# Patient Record
Sex: Male | Born: 1950
Health system: Southern US, Community
[De-identification: ages and names within clinical notes are randomized; demographics above are authoritative.]

## PROBLEM LIST (undated history)

## (undated) DIAGNOSIS — Z85828 Personal history of other malignant neoplasm of skin: Secondary | ICD-10-CM

## (undated) DIAGNOSIS — Z8679 Personal history of other diseases of the circulatory system: Secondary | ICD-10-CM

## (undated) DIAGNOSIS — Z8639 Personal history of other endocrine, nutritional and metabolic disease: Secondary | ICD-10-CM

## (undated) DIAGNOSIS — C61 Malignant neoplasm of prostate: Secondary | ICD-10-CM

## (undated) DIAGNOSIS — I499 Cardiac arrhythmia, unspecified: Secondary | ICD-10-CM

## (undated) DIAGNOSIS — M5020 Other cervical disc displacement, unspecified cervical region: Secondary | ICD-10-CM

## (undated) DIAGNOSIS — E785 Hyperlipidemia, unspecified: Secondary | ICD-10-CM

## (undated) DIAGNOSIS — C4490 Unspecified malignant neoplasm of skin, unspecified: Secondary | ICD-10-CM

## (undated) HISTORY — PX: APPENDECTOMY: SHX54

## (undated) HISTORY — PX: OTHER SURGICAL HISTORY: SHX169

## (undated) HISTORY — DX: Hyperlipidemia, unspecified: E78.5

---

## 2000-10-14 ENCOUNTER — Ambulatory Visit (HOSPITAL_COMMUNITY): Admission: RE | Admit: 2000-10-14 | Discharge: 2000-10-14 | Payer: Self-pay | Admitting: Internal Medicine

## 2000-10-14 ENCOUNTER — Encounter: Payer: Self-pay | Admitting: Internal Medicine

## 2002-11-25 HISTORY — PX: COLONOSCOPY: SHX174

## 2005-11-25 DIAGNOSIS — E785 Hyperlipidemia, unspecified: Secondary | ICD-10-CM

## 2005-11-25 HISTORY — DX: Hyperlipidemia, unspecified: E78.5

## 2006-04-10 ENCOUNTER — Ambulatory Visit: Payer: Self-pay | Admitting: Internal Medicine

## 2006-04-18 ENCOUNTER — Ambulatory Visit: Payer: Self-pay | Admitting: Internal Medicine

## 2006-05-15 ENCOUNTER — Ambulatory Visit: Payer: Self-pay | Admitting: Internal Medicine

## 2006-07-23 ENCOUNTER — Ambulatory Visit: Payer: Self-pay | Admitting: Internal Medicine

## 2007-04-01 ENCOUNTER — Ambulatory Visit: Payer: Self-pay | Admitting: Internal Medicine

## 2007-04-01 LAB — CONVERTED CEMR LAB
ALT: 28 units/L (ref 0–40)
AST: 27 units/L (ref 0–37)

## 2007-08-04 ENCOUNTER — Ambulatory Visit: Payer: Self-pay | Admitting: Internal Medicine

## 2007-08-09 LAB — CONVERTED CEMR LAB
Cholesterol: 135 mg/dL (ref 0–200)
Total CHOL/HDL Ratio: 3.2
VLDL: 40 mg/dL (ref 0–40)

## 2007-08-10 ENCOUNTER — Encounter (INDEPENDENT_AMBULATORY_CARE_PROVIDER_SITE_OTHER): Payer: Self-pay | Admitting: *Deleted

## 2007-08-14 ENCOUNTER — Ambulatory Visit: Payer: Self-pay | Admitting: Internal Medicine

## 2007-08-14 LAB — CONVERTED CEMR LAB: HDL goal, serum: 40 mg/dL

## 2008-10-28 ENCOUNTER — Ambulatory Visit: Payer: Self-pay | Admitting: Internal Medicine

## 2008-11-07 ENCOUNTER — Ambulatory Visit: Payer: Self-pay | Admitting: Internal Medicine

## 2008-11-29 ENCOUNTER — Ambulatory Visit: Payer: Self-pay | Admitting: Internal Medicine

## 2008-12-04 LAB — CONVERTED CEMR LAB
AST: 25 units/L (ref 0–37)
Alkaline Phosphatase: 40 units/L (ref 39–117)
Bilirubin, Direct: 0.1 mg/dL (ref 0.0–0.3)
Cholesterol: 138 mg/dL (ref 0–200)
GFR calc Af Amer: 98 mL/min
Glucose, Bld: 85 mg/dL (ref 70–99)
PSA: 1.17 ng/mL (ref 0.10–4.00)
TSH: 1.48 microintl units/mL (ref 0.35–5.50)
VLDL: 28 mg/dL (ref 0–40)

## 2009-02-02 LAB — CONVERTED CEMR LAB: OCCULT 1: NEGATIVE

## 2010-05-09 ENCOUNTER — Ambulatory Visit: Payer: Self-pay | Admitting: Internal Medicine

## 2010-05-09 LAB — CONVERTED CEMR LAB
Blood in Urine, dipstick: NEGATIVE
Ketones, urine, test strip: NEGATIVE
Urobilinogen, UA: 0.2
WBC Urine, dipstick: NEGATIVE
pH: 5

## 2010-05-13 LAB — CONVERTED CEMR LAB
AST: 23 units/L (ref 0–37)
Albumin: 4.3 g/dL (ref 3.5–5.2)
Alkaline Phosphatase: 45 units/L (ref 39–117)
Bilirubin, Direct: 0.1 mg/dL (ref 0.0–0.3)
CO2: 29 meq/L (ref 19–32)
Cholesterol: 153 mg/dL (ref 0–200)
Eosinophils Absolute: 0.2 10*3/uL (ref 0.0–0.7)
Eosinophils Relative: 3.9 % (ref 0.0–5.0)
HDL: 48 mg/dL (ref >39.00)
LDL Cholesterol: 83 mg/dL (ref 0–99)
Lymphocytes Relative: 32.2 % (ref 12.0–46.0)
MCHC: 35.3 g/dL (ref 30.0–36.0)
MCV: 91.2 fL (ref 78.0–100.0)
Monocytes Relative: 9.1 % (ref 3.0–12.0)
Platelets: 190 10*3/uL (ref 150.0–400.0)
RBC: 4.71 M/uL (ref 4.22–5.81)
RDW: 13.2 % (ref 11.5–14.6)
Sodium: 142 meq/L (ref 135–145)
VLDL: 21.6 mg/dL (ref 0.0–40.0)
WBC: 5.5 10*3/uL (ref 4.5–10.5)

## 2010-06-05 ENCOUNTER — Ambulatory Visit: Payer: Self-pay | Admitting: Internal Medicine

## 2010-06-20 ENCOUNTER — Encounter: Payer: Self-pay | Admitting: Internal Medicine

## 2010-08-22 ENCOUNTER — Encounter: Payer: Self-pay | Admitting: Internal Medicine

## 2010-08-29 ENCOUNTER — Encounter: Payer: Self-pay | Admitting: Internal Medicine

## 2010-09-18 ENCOUNTER — Ambulatory Visit: Payer: Self-pay | Admitting: Internal Medicine

## 2010-12-25 NOTE — Assessment & Plan Note (Signed)
Summary: CPX,LABS PRIOR,[employer] INS/RH......   Vital Signs:  Patient profile:   60 year old male Height:      74 inches Weight:      196.2 pounds BMI:     25.28 Temp:     98.4 degrees F oral Pulse rate:   62 / minute Resp:     14 per minute BP sitting:   121 / 80  (left arm) Cuff size:   large  Vitals Entered By: Shonna Chock (June 05, 2010 9:38 AM)  CC: Lipid Management Comments REVIEWED MED LIST, PATIENT AGREED DOSE AND INSTRUCTION CORRECT  ** Patient offered TDaP, decided to update at a later date**   CC:  Lipid Management.  History of Present Illness: Dale Holmes is here for a physical; he has had LUE tendonitis for 6 weeks after prolonged golfing outing. No Rx to date.   The patient denies muscle aches, GI upset, abdominal pain, flushing, itching, constipation, diarrhea, and fatigue.  The patient denies the following symptoms: chest pain/pressure, exercise intolerance, dypsnea, palpitations, syncope, and pedal edema.  Compliance with medications (by patient report) has been near 100%.  Dietary compliance has been good.  Adjunctive measures currently used by the patient include ASA.    Lipid Management History:      Positive NCEP/ATP III risk factors include male age 16 years old or older.  Negative NCEP/ATP III risk factors include non-diabetic, no family history for ischemic heart disease, non-tobacco-user status, non-hypertensive, no ASHD (atherosclerotic heart disease), no prior stroke/TIA, no peripheral vascular disease, and no history of aortic aneurysm.     Preventive Screening-Counseling & Management  Caffeine-Diet-Exercise     Does Patient Exercise: yes  Allergies (verified): No Known Drug Allergies  Past History:  Past Medical History: Early repolarization ST-T EKG changes  Hyperlipidemia: NMR 2007: LDL 137(2269/1953), HDL 43,TG 116. Framingham study LDL goal = < 160.  Past Surgical History: Colonoscopy 2006, Dr Russella Dar, due 2016 Appendectomy Inguinal  herniorrhaphy R  Family History: Father: MI mid 60s,AAA,prostate cancer, colon cancer,COAD Mother: HTN Siblings: TWIN  brother: prostate cancer, alcohol abuse  Social History: Occupation: Psychologist, educational Married Never Smoked Alcohol use-yes: socially Regular exercise-yes: 45 min CVE / [employer] 5X/week  Review of Systems General:  Denies fatigue, sleep disorder, and weight loss. Eyes:  Denies blurring, double vision, and vision loss-both eyes. ENT:  Complains of decreased hearing; denies difficulty swallowing and hoarseness. CV:  Denies leg cramps with exertion. Resp:  Denies cough and sputum productive. GI:  Denies bloody stools, dark tarry stools, and indigestion. GU:  Denies discharge, dysuria, and hematuria. MS:  Denies joint redness, joint swelling, low back pain, mid back pain, and thoracic pain. Derm:  Denies changes in nail beds, dryness, and hair loss; Erythematous lesion LUE , non healing. Neuro:  Denies headaches, numbness, and tingling. Psych:  Denies anxiety, depression, easily angered, easily tearful, and irritability. Endo:  Denies excessive hunger, excessive thirst, excessive urination, and heat intolerance. Heme:  Denies abnormal bruising and bleeding. Allergy:  Denies itching eyes and sneezing.  Physical Exam  General:  well-nourished, alert,appropriate and cooperative throughout examination Head:  Normocephalic and atraumatic without obvious abnormalities. No apparent alopecia  Eyes:  No corneal or conjunctival inflammation noted.  Perrla. Funduscopic exam benign, without hemorrhages, exudates or papilledema. Ears:  External ear exam shows no significant lesions or deformities.  Otoscopic examination reveals clear canals, tympanic membranes are intact bilaterally without bulging, retraction, inflammation or discharge. Hearing is grossly normal bilaterally. Nose:  External nasal examination shows  no deformity or inflammation. Nasal mucosa are pink and moist without  lesions or exudates. Mouth:  Oral mucosa and oropharynx without lesions or exudates.  Teeth in good repair. Neck:  No deformities, masses, or tenderness noted. Lungs:  Normal respiratory effort, chest expands symmetrically. Lungs are clear to auscultation, no crackles or wheezes. Heart:  regular rhythm, no murmur, no gallop, no rub, no JVD, no HJR, and bradycardia.   Abdomen:  Bowel sounds positive,abdomen soft and non-tender without masses, organomegaly or hernias noted. Rectal:  No external abnormalities noted. Normal sphincter tone. No rectal masses or tenderness. Genitalia:  Testes bilaterally descended without nodularity, tenderness or masses. No scrotal masses or lesions. No penis lesions or urethral discharge. L varicocele.   Prostate:  Prostate gland firm and smooth, no enlargement, nodularity, tenderness, mass, asymmetry or induration.NO PROSTATE ABNORMALITY Msk:  No deformity or scoliosis noted of thoracic or lumbar spine.   Pulses:  R and L carotid,radial,dorsalis pedis and posterior tibial pulses are full and equal bilaterally Extremities:  No clubbing, cyanosis, edema, or deformity noted with normal full range of motion of all joints. Classic tenosynovitis L elbow  Neurologic:  alert & oriented X3 and DTRs symmetrical and normal.   Skin:  9X3 mm erythematous lesion LUE, non balnching with pressure Cervical Nodes:  No lymphadenopathy noted Axillary Nodes:  No palpable lymphadenopathy Inguinal Nodes:  No significant adenopathy Psych:  memory intact for recent and remote, normally interactive, and good eye contact.     Impression & Recommendations:  Problem # 1:  PREVENTIVE HEALTH CARE (ICD-V70.0)  Orders: EKG w/ Interpretation (93000)  Problem # 2:  LATERAL EPICONDYLITIS, LEFT (ICD-726.32)  Problem # 3:  UNSPECIFIED HEARING LOSS (ICD-389.9)  Orders: Audiology (Audio)  Problem # 4:  NEOPLASM, SKIN, UNCERTAIN BEHAVIOR (ICD-238.2)  Orders: Dermatology Referral (Derma) :  Dr Karlyn Agee  Problem # 5:  HYPERLIPIDEMIA (ICD-272.2)  His updated medication list for this problem includes:    Vytorin 10-20 Mg Tabs (Ezetimibe-simvastatin) .Marland Kitchen... Daily as directed  Problem # 6:  FAMILY HISTORY OF MALIGNANT NEOPLASM PROSTATE (ICD-V16.42) Father @ 29; bro @ 59  Complete Medication List: 1)  Vytorin 10-20 Mg Tabs (Ezetimibe-simvastatin) .... Daily as directed  Lipid Assessment/Plan:      Based on NCEP/ATP III, the patient's risk factor category is "0-1 risk factors".  The patient's lipid goals are as follows: Total cholesterol goal is 200; LDL cholesterol goal is 100; HDL cholesterol goal is 40; Triglyceride goal is 150.  His LDL cholesterol goal has been met.    Patient Instructions: 1)  Please schedule a follow-up appointment in 1 year OR please schedule a follow-up appointment as needed.Take an Aspirin every day.Exercises as described for tennis elbow followed by Zostrix cream. Glucosamine sulfate 1500 mg once daily X4-6 weeks.

## 2010-12-25 NOTE — Consult Note (Signed)
Summary: [facility]   Imported By: Lanelle Bal 09/12/2010 08:49:11  _____________________________________________________________________  External Attachment:    Type:   Image     Comment:   External Document

## 2010-12-25 NOTE — Assessment & Plan Note (Signed)
Summary: FLU SHOT//PH  Nurse Visit  CC: Flu shot./kb   Allergies: No Known Drug Allergies  Orders Added: 1)  Admin 1st Vaccine [90471] 2)  Flu Vaccine 3yrs + [90658]         Flu Vaccine Consent Questions     Do you have a history of severe allergic reactions to this vaccine? no    Any prior history of allergic reactions to egg and/or gelatin? no    Do you have a sensitivity to the preservative Thimersol? no    Do you have a past history of Guillan-Barre Syndrome? no    Do you currently have an acute febrile illness? no    Have you ever had a severe reaction to latex? no    Vaccine information given and explained to patient? yes    Are you currently pregnant? no    Lot Number:[id number]   Exp Date:05/25/2011   Site Given  Left Deltoid IMu 

## 2010-12-25 NOTE — Letter (Signed)
Summary: Cancer Screening/[facility] Personalized Risk Profile  Cancer Screening/[facility] Personalized Risk Profile   Imported By: Lanelle Bal 06/11/2010 13:43:35  _____________________________________________________________________  External Attachment:    Type:   Image     Comment:   External Document

## 2010-12-25 NOTE — Therapy (Signed)
Summary: [facility]  [facility]   Imported By: Lanelle Bal 08/20/2010 09:21:53  _____________________________________________________________________  External Attachment:    Type:   Image     Comment:   External Document

## 2011-02-04 ENCOUNTER — Ambulatory Visit: Payer: 59

## 2011-02-04 ENCOUNTER — Encounter: Payer: Self-pay | Admitting: Internal Medicine

## 2011-02-04 DIAGNOSIS — Z23 Encounter for immunization: Secondary | ICD-10-CM

## 2011-02-12 NOTE — Assessment & Plan Note (Signed)
Summary: TETANUS    Other Orders: Tdap => 67yrs IM (16109) Admin 1st Vaccine (60454)   Orders Added: 1)  Tdap => 7yrs IM [90715] 2)  Admin 1st Vaccine [09811]   Immunizations Administered:  Tetanus Vaccine:    Vaccine Type: Tdap    Site: left deltoid    Mfr: GlaxoSmithKline    Dose: 0.5 ml    Route: IM    Given by: Doristine Devoid CMA    Exp. Date: 10/18/2012    Lot #: [id number]   Immunizations Administered:  Tetanus Vaccine:    Vaccine Type: Tdap    Site: left deltoid    Mfr: GlaxoSmithKline    Dose: 0.5 ml    Route: IM    Given by: Doristine Devoid CMA    Exp. Date: 10/18/2012    Lot #: [id number]

## 2011-08-14 ENCOUNTER — Other Ambulatory Visit: Payer: Self-pay | Admitting: Internal Medicine

## 2011-09-27 ENCOUNTER — Encounter: Payer: Self-pay | Admitting: Internal Medicine

## 2011-09-30 ENCOUNTER — Ambulatory Visit (INDEPENDENT_AMBULATORY_CARE_PROVIDER_SITE_OTHER): Payer: 59 | Admitting: Internal Medicine

## 2011-09-30 ENCOUNTER — Encounter: Payer: Self-pay | Admitting: Internal Medicine

## 2011-09-30 VITALS — BP 110/78 | HR 66 | Temp 98.2°F | Resp 12 | Ht 74.5 in | Wt 193.4 lb

## 2011-09-30 DIAGNOSIS — C4491 Basal cell carcinoma of skin, unspecified: Secondary | ICD-10-CM | POA: Insufficient documentation

## 2011-09-30 DIAGNOSIS — E785 Hyperlipidemia, unspecified: Secondary | ICD-10-CM

## 2011-09-30 DIAGNOSIS — Z0000 Encounter for general adult medical examination without abnormal findings: Secondary | ICD-10-CM

## 2011-09-30 NOTE — Patient Instructions (Signed)
Preventive Health Care: Exercise at least 30-45 minutes a day,  3-4 days a week.  Eat a low-fat diet with lots of fruits and vegetables, up to 7-9 servings per day.

## 2011-09-30 NOTE — Progress Notes (Signed)
  Subjective:    Patient ID: Dale Holmes, male    DOB: Jan 27, 1951, 60 y.o.   MRN: [id number]  HPI  Dale Holmes  is here for a physical; he has no acute issues.      Review of Systems Patient reports no  vision/ hearing changes,anorexia, weight change, fever ,adenopathy, persistant / recurrent hoarseness, swallowing issues, chest pain,palpitations, edema,persistant / recurrent cough, hemoptysis, dyspnea(rest, exertional, paroxysmal nocturnal), gastrointestinal  bleeding (melena, rectal bleeding), abdominal pain, excessive heart burn, GU symptoms( dysuria, hematuria, pyuria, voiding/incontinence  issues) syncope, focal weakness, memory loss,numbness & tingling, skin/hair/nail changes,depression, anxiety, abnormal bruising/bleeding, or  musculoskeletal symptoms/signs.      Objective:   Physical Exam Gen.: Thin but healthy and well-nourished in appearance. Alert, appropriate and cooperative throughout exam. Head: Normocephalic without obvious abnormalities;  no alopecia  Eyes: No corneal or conjunctival inflammation noted. Pupils equal round reactive to light and accommodation. Fundal exam is benign without hemorrhages, exudate, papilledema. Extraocular motion intact. Vision grossly normal. Ears: External  ear exam reveals no significant lesions or deformities. Canals clear .TMs normal. Hearing is grossly normal bilaterally. Nose: External nasal exam reveals no deformity or inflammation. Nasal mucosa are pink and moist. No lesions or exudates noted.  Mouth: Oral mucosa and oropharynx reveal no lesions or exudates. Teeth in good repair. Neck: No deformities, masses, or tenderness noted. Range of motion & . Thyroid normal. Lungs: Normal respiratory effort; chest expands symmetrically. Lungs are clear to auscultation without rales, wheezes, or increased work of breathing. Heart: Normal rate and rhythm. Normal S1 and S2. No gallop, click, or rub. No murmur. Abdomen: Bowel sounds normal; abdomen soft and  nontender. No masses, organomegaly or hernias noted. Genitalia/DRE : There is a  varicoele in the left scrotum. Prostate is firm but not enlarged; no nodules present.                                                                                  Musculoskeletal/extremities: No deformity or scoliosis noted of  the thoracic or lumbar spine. No clubbing, cyanosis, edema, or deformity noted. Range of motion  normal .Tone & strength  normal.Joints normal. Nail health  good. Vascular: Carotid, radial artery, dorsalis pedis and  posterior tibial pulses are full and equal. No bruits present. Neurologic: Alert and oriented x3. Deep tendon reflexes symmetrical and normal.          Skin: Intact without suspicious lesions or rashes. Lymph: No cervical, axillary, or inguinal lymphadenopathy present. Psych: Mood and affect are normal. Normally interactive                                                                                         Assessment & Plan:  #1 comprehensive physical exam; no acute findings #2 see Problem List with Assessments & Recommendations Plan: see Orders

## 2011-10-01 LAB — BASIC METABOLIC PANEL
Calcium: 8.8 mg/dL (ref 8.4–10.5)
GFR: 81.26 mL/min (ref >60.00)
Glucose, Bld: 83 mg/dL (ref 70–99)
Sodium: 140 mEq/L (ref 135–145)

## 2011-10-01 LAB — PSA: PSA: 1.6 ng/mL (ref 0.10–4.00)

## 2011-10-01 LAB — CBC WITH DIFFERENTIAL/PLATELET
Basophils Absolute: 0 10*3/uL (ref 0.0–0.1)
Hemoglobin: 15.4 g/dL (ref 13.0–17.0)
Lymphocytes Relative: 32.8 % (ref 12.0–46.0)
Monocytes Relative: 5.6 % (ref 3.0–12.0)
Neutro Abs: 2 10*3/uL (ref 1.4–7.7)
RDW: 13.4 % (ref 11.5–14.6)

## 2011-10-01 LAB — HEPATIC FUNCTION PANEL
ALT: 18 U/L (ref 0–53)
AST: 26 U/L (ref 0–37)
Albumin: 4.4 g/dL (ref 3.5–5.2)
Total Protein: 6.8 g/dL (ref 6.0–8.3)

## 2011-10-01 LAB — LIPID PANEL
Cholesterol: 153 mg/dL (ref 0–200)
HDL: 52.4 mg/dL (ref >39.00)
Triglycerides: 76 mg/dL (ref 0.0–149.0)
VLDL: 15.2 mg/dL (ref 0.0–40.0)

## 2012-02-14 ENCOUNTER — Other Ambulatory Visit: Payer: Self-pay | Admitting: Internal Medicine

## 2012-02-14 NOTE — Telephone Encounter (Signed)
Refill done.  

## 2012-06-22 ENCOUNTER — Other Ambulatory Visit: Payer: Self-pay | Admitting: Internal Medicine

## 2012-12-29 ENCOUNTER — Other Ambulatory Visit: Payer: Self-pay | Admitting: Internal Medicine

## 2013-02-16 ENCOUNTER — Encounter: Payer: Self-pay | Admitting: Internal Medicine

## 2013-02-16 ENCOUNTER — Ambulatory Visit (INDEPENDENT_AMBULATORY_CARE_PROVIDER_SITE_OTHER): Payer: 59 | Admitting: Internal Medicine

## 2013-02-16 VITALS — BP 114/70 | HR 64 | Temp 97.8°F | Resp 12 | Ht 74.5 in | Wt 194.0 lb

## 2013-02-16 DIAGNOSIS — R9431 Abnormal electrocardiogram [ECG] [EKG]: Secondary | ICD-10-CM

## 2013-02-16 DIAGNOSIS — Z0000 Encounter for general adult medical examination without abnormal findings: Secondary | ICD-10-CM

## 2013-02-16 DIAGNOSIS — Z8042 Family history of malignant neoplasm of prostate: Secondary | ICD-10-CM | POA: Insufficient documentation

## 2013-02-16 MED ORDER — EZETIMIBE-SIMVASTATIN 10-20 MG PO TABS: ORAL_TABLET | ORAL | Status: DC

## 2013-02-16 NOTE — Progress Notes (Signed)
  Subjective:    Patient ID: Dale Holmes, male    DOB: 14-Aug-1951, 62 y.o.   MRN: [id number]  HPI   Fallon is here for a physical; he denies acute issues.     Review of Systems He is on a heart healthy diet; he exercises 50 minutes >5 times per week without symptoms. Specifically he denies chest pain, palpitations, dyspnea, or claudication. Family history is negative for premature coronary disease. Advanced cholesterol testing reveals his LDL goal was less than 100, ideally < 70.    Objective:   Physical Exam Gen.:Thin but healthy and well-nourished in appearance. Alert, appropriate and cooperative throughout exam.Appears younger than stated age  Head: Normocephalic without obvious abnormalities. Eyes: No corneal or conjunctival inflammation noted. Pupils equal round reactive to light and accommodation. Fundal exam is benign without hemorrhages, exudate, papilledema. Extraocular motion intact. Vision grossly normal without lenses Ears: External  ear exam reveals no significant lesions or deformities. Canals clear .TMs normal. Hearing is grossly normal bilaterally. Nose: External nasal exam reveals no deformity or inflammation. Nasal mucosa are pink and moist. No lesions or exudates noted.  Mouth: Oral mucosa and oropharynx reveal no lesions or exudates. Teeth in good repair. Neck: No deformities, masses, or tenderness noted. Range of motion & Thyroid normal. Lungs: Normal respiratory effort; chest expands symmetrically. Lungs are clear to auscultation without rales, wheezes, or increased work of breathing. Heart: Normal rate and rhythm. Normal S1 and S2. No gallop, click, or rub. S4 with slurring ; no significant murmur. Abdomen: Bowel sounds normal; abdomen soft and nontender. No masses, organomegaly or hernias noted. Genitalia: Genitalia normal except for left varices. Slight weakness R inguinal area Prostate is firm  without enlargement, asymmetry, nodularity, or induration.           Musculoskeletal/extremities: No deformity or scoliosis noted of  the thoracic or lumbar spine.  No clubbing, cyanosis, edema, or significant extremity  deformity noted. Range of motion normal .Tone & strength  Normal. Joints normal . Nail health good. Able to lie down & sit up w/o help. Negative SLR bilaterally Vascular: Carotid, radial artery, dorsalis pedis and  posterior tibial pulses are full and equal. No bruits present. Neurologic: Alert and oriented x3. Deep tendon reflexes symmetrical and normal.       Skin: Intact without suspicious lesions or rashes. Lymph: No cervical, axillary, or inguinal lymphadenopathy present. Psych: Mood and affect are normal. Normally interactive                                                                                        Assessment & Plan:  #1 comprehensive physical exam; no acute findings  Plan: see Orders  & Recommendations

## 2013-02-16 NOTE — Patient Instructions (Addendum)
Please  schedule fasting Labs : BMET,Lipids, hepatic panel, CBC & dif, TSH, PSA.PLEASE BRING THESE INSTRUCTIONS TO FOLLOW UP  LAB APPOINTMENT.This will guarantee correct labs are drawn, eliminating need for repeat blood sampling ( needle sticks ! ). Diagnoses /Codes: V70.0. Take the EKG to any emergency room or preop visits. There are nonspecific changes; as long as there is no new change these are not clinically significant . If the old EKG is not available for comparison; it may result in unnecessary hospitalization for observation with significant unnecessary expense.   If you activate the  My Chart system; lab & Xray results will be released directly  to you as soon as I review & address these through the computer. If you choose not to sign up for My Chart within 36 hours of labs being drawn; results will be reviewed & interpretation added before being copied & mailed, causing a delay in getting the results to you.If you do not receive that report within 7-10 days ,please call. Additionally you can use this system to gain direct  access to your records  if  out of town or @ an office of a  physician who is not in  the My Chart network.  This improves continuity of care & places you in control of your medical record.

## 2013-02-18 ENCOUNTER — Other Ambulatory Visit (INDEPENDENT_AMBULATORY_CARE_PROVIDER_SITE_OTHER): Payer: 59

## 2013-02-18 DIAGNOSIS — Z0000 Encounter for general adult medical examination without abnormal findings: Secondary | ICD-10-CM

## 2013-02-18 LAB — BASIC METABOLIC PANEL
BUN: 21 mg/dL (ref 6–23)
Calcium: 8.3 mg/dL — ABNORMAL LOW (ref 8.4–10.5)
Creatinine, Ser: 1.1 mg/dL (ref 0.4–1.5)
GFR: 72.39 mL/min (ref >60.00)
Glucose, Bld: 93 mg/dL (ref 70–99)

## 2013-02-18 LAB — HEPATIC FUNCTION PANEL
ALT: 25 U/L (ref 0–53)
AST: 27 U/L (ref 0–37)
Alkaline Phosphatase: 42 U/L (ref 39–117)
Bilirubin, Direct: 0.1 mg/dL (ref 0.0–0.3)
Total Bilirubin: 0.8 mg/dL (ref 0.3–1.2)
Total Protein: 6.7 g/dL (ref 6.0–8.3)

## 2013-02-18 LAB — CBC WITH DIFFERENTIAL/PLATELET
Basophils Absolute: 0 10*3/uL (ref 0.0–0.1)
HCT: 43.8 % (ref 39.0–52.0)
Lymphocytes Relative: 31.4 % (ref 12.0–46.0)
Lymphs Abs: 1.6 10*3/uL (ref 0.7–4.0)
Monocytes Relative: 7.2 % (ref 3.0–12.0)
Neutrophils Relative %: 55.7 % (ref 43.0–77.0)
Platelets: 202 10*3/uL (ref 150.0–400.0)
RDW: 13.6 % (ref 11.5–14.6)
WBC: 5.1 10*3/uL (ref 4.5–10.5)

## 2013-02-18 LAB — PSA: PSA: 1.87 ng/mL (ref 0.10–4.00)

## 2013-02-18 LAB — TSH: TSH: 1.17 u[IU]/mL (ref 0.35–5.50)

## 2013-02-18 LAB — LIPID PANEL: Cholesterol: 148 mg/dL (ref 0–200)

## 2013-03-30 ENCOUNTER — Encounter: Payer: Self-pay | Admitting: Gastroenterology

## 2013-04-23 ENCOUNTER — Other Ambulatory Visit: Payer: Self-pay | Admitting: Internal Medicine

## 2013-04-23 NOTE — Telephone Encounter (Signed)
Spoke to pharmacy, they did not receive the rx sent in on 3.25.14. Re-sent rx.

## 2013-09-30 ENCOUNTER — Other Ambulatory Visit: Payer: Self-pay

## 2013-10-16 ENCOUNTER — Encounter: Payer: Self-pay | Admitting: Internal Medicine

## 2013-12-31 ENCOUNTER — Telehealth: Payer: Self-pay | Admitting: Internal Medicine

## 2013-12-31 MED ORDER — EZETIMIBE-SIMVASTATIN 10-20 MG PO TABS: ORAL_TABLET | ORAL | Status: DC

## 2013-12-31 NOTE — Telephone Encounter (Signed)
Rx sent to the pharmacy by e-script.//AB/CMA 

## 2013-12-31 NOTE — Telephone Encounter (Signed)
Patient called stating his pharmacy has been trying to contact us to refill his vytorin. Pt uses [facility]

## 2014-01-18 ENCOUNTER — Telehealth: Payer: Self-pay | Admitting: *Deleted

## 2014-01-18 DIAGNOSIS — E785 Hyperlipidemia, unspecified: Secondary | ICD-10-CM

## 2014-01-18 NOTE — Telephone Encounter (Addendum)
Spoke with the pt on (01-17-14) and informed him that we received a fax from the pharmacy that the Vytorin 10-20mg  is not covered by his insurance,so Dr. Linna Darner recommended to switch to Simvastatin 20mg  daily #90.  Then he will need to have fasting labs(Lipids) done 10 weeks after starting new med.  Pt understood and agreed.  Future lab ordered and sent.  Order for change of meds was faxed to the pharmacy.//AB/CMA

## 2014-02-18 ENCOUNTER — Ambulatory Visit (INDEPENDENT_AMBULATORY_CARE_PROVIDER_SITE_OTHER): Payer: BC Managed Care – PPO | Admitting: Internal Medicine

## 2014-02-18 ENCOUNTER — Encounter: Payer: Self-pay | Admitting: Internal Medicine

## 2014-02-18 VITALS — BP 100/80 | HR 60 | Temp 98.0°F | Resp 14 | Ht 74.0 in | Wt 196.8 lb

## 2014-02-18 DIAGNOSIS — Z0000 Encounter for general adult medical examination without abnormal findings: Secondary | ICD-10-CM

## 2014-02-18 DIAGNOSIS — E785 Hyperlipidemia, unspecified: Secondary | ICD-10-CM

## 2014-02-18 MED ORDER — SIMVASTATIN 20 MG PO TABS: 20.0000 mg | ORAL_TABLET | Freq: Every day | ORAL | Status: DC

## 2014-02-18 NOTE — Progress Notes (Signed)
   Subjective:    Patient ID: Dale Holmes, male    DOB: 08/30/1951, 63 y.o.   MRN: [id number]  HPI  He  is here for a physical;acute issues include diffuse, intermittent arthralgias     Review of Systems A heart healthy diet is followed; exercise encompasses 45-55 minutes > 5 times per week as treadmill & weights without symptoms.  Family history is negative for premature coronary disease. Advanced cholesterol testing reveals  LDL goal is less than 100 ; ideally < 70 . There is medication compliance with the statin.  Low dose ASA taken Specifically denied are  chest pain, palpitations, dyspnea, or claudication.  Significant abdominal symptoms, memory deficit, or myalgias not present.     Objective:   Physical Exam Gen.: Healthy and well-nourished in appearance. Alert, appropriate and cooperative throughout exam.  Head: Normocephalic without obvious abnormalities;  no alopecia  Eyes: No corneal or conjunctival inflammation noted. Pupils minimally unequal, OD larger than OS. Extraocular motion intact.  Ears: External  ear exam reveals no significant lesions or deformities. Canals clear .TMs normal. Hearing is grossly normal bilaterally. Nose: External nasal exam reveals no deformity or inflammation. Nasal mucosa are pink and moist. No lesions or exudates noted.   Mouth: Oral mucosa and oropharynx reveal no lesions or exudates. Teeth in good repair. Neck: No deformities, masses, or tenderness noted. Range of motion & Thyroid normal Lungs: Normal respiratory effort; chest expands symmetrically. Lungs are clear to auscultation without rales, wheezes, or increased work of breathing. Heart: Normal rate and rhythm. Normal S1 and S2. No gallop, click, or rub. Slight apical click w/o murmur. Abdomen: Bowel sounds normal; abdomen soft and nontender. No masses, organomegaly or hernias noted. Genitalia:  MALE:Genitalia normal except for left varices. Prostate without enlargement,asymmetry,or  nodularity. Right lobe feels more firm than left                                 Musculoskeletal/extremities: No deformity or scoliosis noted of  the thoracic or lumbar spine.   No clubbing, cyanosis, edema, or significant extremity  deformity noted. Range of motion normal .Tone & strength normal. Hand joints normal  Fingernail  health good. Able to lie down & sit up w/o help. Negative SLR bilaterally Vascular: Carotid, radial artery, dorsalis pedis and  posterior tibial pulses are full and equal. No bruits present. Neurologic: Alert and oriented x3. Deep tendon reflexes symmetrical and normal.  Gait normal  Skin: Intact without suspicious lesions or rashes. Lymph: No cervical, axillary lymphadenopathy present. Psych: Mood and affect are normal. Normally interactive                                                                                        Assessment & Plan:  #1 comprehensive physical exam; no acute findings  Plan: see Orders  & Recommendations

## 2014-02-18 NOTE — Patient Instructions (Signed)
Your next office appointment will be determined based upon review of your pending labs in 8-10 weeks. Those instructions will be transmitted to you through My Chart .

## 2014-02-18 NOTE — Progress Notes (Signed)
Pre visit review using our clinic review tool, if applicable. No additional management support is needed unless otherwise documented below in the visit note. 

## 2014-04-15 ENCOUNTER — Other Ambulatory Visit: Payer: Self-pay | Admitting: Internal Medicine

## 2014-06-21 DIAGNOSIS — H52229 Regular astigmatism, unspecified eye: Secondary | ICD-10-CM | POA: Diagnosis not present

## 2014-06-21 DIAGNOSIS — H524 Presbyopia: Secondary | ICD-10-CM | POA: Diagnosis not present

## 2014-06-21 DIAGNOSIS — H5200 Hypermetropia, unspecified eye: Secondary | ICD-10-CM | POA: Diagnosis not present

## 2014-06-30 ENCOUNTER — Ambulatory Visit (INDEPENDENT_AMBULATORY_CARE_PROVIDER_SITE_OTHER): Payer: BC Managed Care – PPO | Admitting: *Deleted

## 2014-06-30 DIAGNOSIS — Z23 Encounter for immunization: Secondary | ICD-10-CM

## 2014-06-30 DIAGNOSIS — Z2911 Encounter for prophylactic immunotherapy for respiratory syncytial virus (RSV): Secondary | ICD-10-CM | POA: Diagnosis not present

## 2014-10-17 ENCOUNTER — Telehealth: Payer: Self-pay | Admitting: Internal Medicine

## 2014-10-17 NOTE — Telephone Encounter (Signed)
Pt called request refill for simvastatin to be send to [facility]. Please advise.

## 2014-10-18 MED ORDER — SIMVASTATIN 20 MG PO TABS: ORAL_TABLET | ORAL | Status: DC

## 2014-10-18 NOTE — Telephone Encounter (Signed)
OK X 3 mos Will need fasting labs & OV by 01/2015 with me

## 2014-10-18 NOTE — Telephone Encounter (Signed)
Called pt no answer LMOM with md response.../lmb 

## 2014-11-23 ENCOUNTER — Telehealth: Payer: Self-pay | Admitting: Internal Medicine

## 2014-11-23 NOTE — Telephone Encounter (Signed)
Patient has been advised orders are placed

## 2014-11-23 NOTE — Telephone Encounter (Signed)
Orders would expire in June; he can come in several days pre CPX

## 2014-11-23 NOTE — Telephone Encounter (Signed)
Scheduled patient for April on a CPE.  Patient would like to know if he needs to come in sooner for labs.  Noticed labs are entered in for June.

## 2014-12-23 ENCOUNTER — Telehealth: Payer: Self-pay | Admitting: *Deleted

## 2014-12-23 NOTE — Telephone Encounter (Signed)
LVM to see if pt already had Flu shot done or need an appointment to get one. 

## 2014-12-29 ENCOUNTER — Other Ambulatory Visit (INDEPENDENT_AMBULATORY_CARE_PROVIDER_SITE_OTHER): Payer: Medicare Other

## 2014-12-29 DIAGNOSIS — E785 Hyperlipidemia, unspecified: Secondary | ICD-10-CM | POA: Diagnosis not present

## 2014-12-29 DIAGNOSIS — Z125 Encounter for screening for malignant neoplasm of prostate: Secondary | ICD-10-CM | POA: Diagnosis not present

## 2014-12-29 DIAGNOSIS — Z0000 Encounter for general adult medical examination without abnormal findings: Secondary | ICD-10-CM | POA: Diagnosis not present

## 2014-12-29 LAB — CBC WITH DIFFERENTIAL/PLATELET
BASOS ABS: 0 10*3/uL (ref 0.0–0.1)
BASOS PCT: 0.6 % (ref 0.0–3.0)
EOS ABS: 0.2 10*3/uL (ref 0.0–0.7)
EOS PCT: 4.4 % (ref 0.0–5.0)
HCT: 43.4 % (ref 39.0–52.0)
HEMOGLOBIN: 15.3 g/dL (ref 13.0–17.0)
LYMPHS ABS: 1.5 10*3/uL (ref 0.7–4.0)
LYMPHS PCT: 28.4 % (ref 12.0–46.0)
MCHC: 35.3 g/dL (ref 30.0–36.0)
MCV: 88.5 fl (ref 78.0–100.0)
Monocytes Absolute: 0.4 10*3/uL (ref 0.1–1.0)
Monocytes Relative: 8.2 % (ref 3.0–12.0)
Neutro Abs: 3.1 10*3/uL (ref 1.4–7.7)
Neutrophils Relative %: 58.4 % (ref 43.0–77.0)
Platelets: 198 10*3/uL (ref 150.0–400.0)
RBC: 4.9 Mil/uL (ref 4.22–5.81)
RDW: 13.5 % (ref 11.5–15.5)
WBC: 5.3 10*3/uL (ref 4.0–10.5)

## 2014-12-29 LAB — BASIC METABOLIC PANEL
BUN: 21 mg/dL (ref 6–23)
CO2: 25 mEq/L (ref 19–32)
CREATININE: 0.98 mg/dL (ref 0.40–1.50)
Calcium: 8.9 mg/dL (ref 8.4–10.5)
Chloride: 106 mEq/L (ref 96–112)
GFR: 81.37 mL/min (ref >60.00)
Glucose, Bld: 89 mg/dL (ref 70–99)
Potassium: 4.1 mEq/L (ref 3.5–5.1)
SODIUM: 139 meq/L (ref 135–145)

## 2014-12-29 LAB — HEPATIC FUNCTION PANEL
ALBUMIN: 4.3 g/dL (ref 3.5–5.2)
ALK PHOS: 45 U/L (ref 39–117)
ALT: 20 U/L (ref 0–53)
AST: 21 U/L (ref 0–37)
BILIRUBIN TOTAL: 0.7 mg/dL (ref 0.2–1.2)
Bilirubin, Direct: 0.1 mg/dL (ref 0.0–0.3)
TOTAL PROTEIN: 6.4 g/dL (ref 6.0–8.3)

## 2014-12-29 LAB — TSH: TSH: 1.47 u[IU]/mL (ref 0.35–4.50)

## 2014-12-29 LAB — LDL CHOLESTEROL, DIRECT: LDL DIRECT: 72 mg/dL

## 2014-12-29 LAB — LIPID PANEL
CHOL/HDL RATIO: 4
Cholesterol: 164 mg/dL (ref 0–200)
HDL: 43.8 mg/dL (ref >39.00)
NONHDL: 120.2
Triglycerides: 270 mg/dL — ABNORMAL HIGH (ref 0.0–149.0)
VLDL: 54 mg/dL — AB (ref 0.0–40.0)

## 2014-12-29 LAB — PSA: PSA: 1.92 ng/mL (ref 0.10–4.00)

## 2015-02-02 ENCOUNTER — Other Ambulatory Visit: Payer: Self-pay

## 2015-02-02 MED ORDER — SIMVASTATIN 20 MG PO TABS: ORAL_TABLET | ORAL | Status: DC

## 2015-02-10 ENCOUNTER — Telehealth: Payer: Self-pay

## 2015-02-10 NOTE — Telephone Encounter (Signed)
No answer, no way to leave message about flu vaccine

## 2015-02-24 ENCOUNTER — Ambulatory Visit (INDEPENDENT_AMBULATORY_CARE_PROVIDER_SITE_OTHER): Payer: Medicare Other | Admitting: Internal Medicine

## 2015-02-24 ENCOUNTER — Encounter: Payer: Self-pay | Admitting: Internal Medicine

## 2015-02-24 VITALS — BP 112/90 | HR 67 | Temp 97.9°F | Ht 74.0 in | Wt 194.8 lb

## 2015-02-24 DIAGNOSIS — Z8042 Family history of malignant neoplasm of prostate: Secondary | ICD-10-CM | POA: Diagnosis not present

## 2015-02-24 DIAGNOSIS — N429 Disorder of prostate, unspecified: Secondary | ICD-10-CM | POA: Insufficient documentation

## 2015-02-24 DIAGNOSIS — E785 Hyperlipidemia, unspecified: Secondary | ICD-10-CM | POA: Diagnosis not present

## 2015-02-24 NOTE — Progress Notes (Signed)
Pre visit review using our clinic review tool, if applicable. No additional management support is needed unless otherwise documented below in the visit note. 

## 2015-02-24 NOTE — Assessment & Plan Note (Signed)
Options & risks discussed

## 2015-02-24 NOTE — Patient Instructions (Signed)
As per the Standard of Care , screening Colonoscopy recommended @ 50 & every 5-10 years thereafter . More frequent monitor would be dictated by family history or findings @ Colonoscopy  Hold Simvastatin. Please follow a Mediaterranean type diet  (many good cook books readily available) or review Dr Nunzio Cory book Eat, [place] for best  dietary cholesterol information & options.  Continue excellent cardiovascular exercise program 30-45 minutes 3-4 times per week. An advanced cholesterol panel (NMR Lipoprofile Lipid Panel) after 4 months of nutritional & exercise changes is best way to optimally assess long LDL risk.

## 2015-02-24 NOTE — Assessment & Plan Note (Signed)
Urology assessment

## 2015-02-24 NOTE — Assessment & Plan Note (Addendum)
  Asked to follow a Mediaterranean type diet  (many good cook books readily available) or review Dr Nunzio Cory book Eat, [place] for best  dietary cholesterol information & options.   Continue excellent cardiovascular exercise program 30-45 minutes 3-4 times per week.  An advanced cholesterol panel (NMR Lipoprofile Lipid Panel) after 4 months of nutritional  to optimally assess long LDL risk.

## 2015-02-24 NOTE — Progress Notes (Signed)
Subjective:    Patient ID: Dale Holmes, male    DOB: 03/30/1951, 64 y.o.   MRN: [id number]  HPI  He had run out of his statin approximately 2 weeks ago. Prior to that he had been compliant with the medication without adverse effects.  Lipids 12/29/14 while on simvastatin revealed LDL of 72 and HDL of 43.8. At that time triglycerides were 270 which he attributes to suboptimal diet over the holidays. He is now on a modified heart healthy diet. He exercises 6 days a week on a treadmill for 0.5-3 miles at a time. He has no associated cardiopulmonary symptoms. His brother had TIAs at 71; his father had heart attack at 30. There is no other family history heart attack or stroke.  Both his father and his twin had prostate cancer. His most recent PSA was 1.92. He had seen Dr. Gaynelle Arabian remotely.No GU symptoms.  Colonoscopy is due. He plans to have this done after his son's wedding in June. He has no active GI symptoms at this time    Review of Systems  Chest pain, palpitations, tachycardia, exertional dyspnea, paroxysmal nocturnal dyspnea, claudication or edema are absent.  Dysuria, pyuria, hematuria, frequency, nocturia or polyuria are denied.  Unexplained weight loss, abdominal pain, significant dyspepsia, dysphagia, melena, rectal bleeding, or persistently small caliber stools are denied.    Objective:   Physical Exam  Gen.: Adequately nourished in appearance. Alert, appropriate and cooperative throughout exam. Appears younger than stated age  Head: Normocephalic without obvious abnormalities  Eyes: No corneal or conjunctival inflammation noted. Pupils equal round reactive to light and accommodation. Extraocular motion intact.  Ears: External  ear exam reveals no significant lesions or deformities. Canals clear .TMs normal. Hearing is slightly decreased on L Nose: External nasal exam reveals no deformity or inflammation. Nasal mucosa are pink and moist. No lesions or exudates noted.     Mouth: Oral mucosa and oropharynx reveal no lesions or exudates. Teeth in good repair. Neck: No deformities, masses, or tenderness noted. Range of motion & Thyroid normal Lungs: Normal respiratory effort; chest expands symmetrically. Lungs are clear to auscultation without rales, wheezes, or increased work of breathing. Heart: Normal rate and rhythm. Normal S1 and S2. No gallop, click, or rub. No murmur. Abdomen: Bowel sounds normal; abdomen soft and nontender. No masses, organomegaly or hernias noted. Genitalia: Genitalia normal except for left varices. Prostate is asymmetric, slightly larger on L with irregular contour and suggestion of induration                                  Musculoskeletal/extremities: No deformity or scoliosis noted of  the thoracic or lumbar spine.  No clubbing, cyanosis, edema, or significant extremity  deformity noted.  Range of motion normal . Tone & strength normal. Hand joints normal  Fingernail  health good. Able to lie down & sit up w/o help.  Negative SLR bilaterally Vascular: Carotid, radial artery, dorsalis pedis and  posterior tibial pulses are full and equal. No bruits present. Neurologic: Alert and oriented x3. Deep tendon reflexes symmetrical and normal.  Gait normal    Skin: Intact without suspicious lesions or rashes. Lymph: No cervical, axillary, or inguinal lymphadenopathy present. Psych: Mood and affect are normal. Normally interactive  Assessment & Plan:  See Current Assessment & Plan in Problem List under specific Diagnosis

## 2015-02-27 ENCOUNTER — Telehealth: Payer: Self-pay

## 2015-02-27 NOTE — Telephone Encounter (Signed)
Left message for patient to call back  

## 2015-02-27 NOTE — Telephone Encounter (Signed)
-----   Message from Ladene Artist, MD sent at 02/24/2015  3:15 PM EDT ----- Linus Orn,  Sure. We will contact him and arrange his colonoscopy in May. Nadiyah Zeis please help patient set this up.  Norberto Sorenson   ----- Message -----    From: Hendricks Limes, MD    Sent: 02/24/2015   1:21 PM      To: Ladene Artist, MD  Norberto Sorenson, he is overdue for his colonoscopy. His son is getting married June 18. Will be possible to have a screening colonoscopy done in May ? Thank you, Hopp

## 2015-03-02 NOTE — Telephone Encounter (Signed)
Left message for patient to call back  

## 2015-03-03 NOTE — Telephone Encounter (Signed)
I spoke with the patient.  He is not ready to schedule a colonoscopy.  He needs to get with his wife to get her schedule.  He will call back when he is ready to schedule.

## 2015-03-03 NOTE — Telephone Encounter (Signed)
Pt wants to wait to schedule

## 2015-03-13 ENCOUNTER — Encounter: Payer: Self-pay | Admitting: Gastroenterology

## 2015-03-15 ENCOUNTER — Encounter: Payer: Self-pay | Admitting: Internal Medicine

## 2015-03-27 ENCOUNTER — Ambulatory Visit (AMBULATORY_SURGERY_CENTER): Payer: Self-pay | Admitting: *Deleted

## 2015-03-27 VITALS — Ht 74.0 in | Wt 191.0 lb

## 2015-03-27 DIAGNOSIS — Z1211 Encounter for screening for malignant neoplasm of colon: Secondary | ICD-10-CM

## 2015-03-27 MED ORDER — NA SULFATE-K SULFATE-MG SULF 17.5-3.13-1.6 GM/177ML PO SOLN: 1.0000 | ORAL | Status: DC

## 2015-03-27 NOTE — Progress Notes (Signed)
Patient denies any allergies to eggs or soy. Patient denies any problems with anesthesia/sedation. Patient denies any oxygen use at home and does not take any diet/weight loss medications. EMMI education assisgned to patient on colonoscopy, this was explained and instructions given to patient. 

## 2015-03-28 ENCOUNTER — Encounter: Payer: Self-pay | Admitting: Gastroenterology

## 2015-04-03 ENCOUNTER — Encounter: Payer: Self-pay | Admitting: Gastroenterology

## 2015-04-03 ENCOUNTER — Ambulatory Visit (AMBULATORY_SURGERY_CENTER): Payer: Medicare Other | Admitting: Gastroenterology

## 2015-04-03 VITALS — BP 104/61 | HR 57 | Temp 97.9°F | Resp 20 | Ht 74.0 in | Wt 191.0 lb

## 2015-04-03 DIAGNOSIS — Z1211 Encounter for screening for malignant neoplasm of colon: Secondary | ICD-10-CM

## 2015-04-03 MED ORDER — SODIUM CHLORIDE 0.9 % IV SOLN: 500.0000 mL | INTRAVENOUS | Status: DC

## 2015-04-03 NOTE — Patient Instructions (Signed)
YOU HAD AN ENDOSCOPIC PROCEDURE TODAY AT THE [facility]:   Refer to the procedure report that was given to you for any specific questions about what was found during the examination.  If the procedure report does not answer your questions, please call your gastroenterologist to clarify.  If you requested that your care partner not be given the details of your procedure findings, then the procedure report has been included in a sealed envelope for you to review at your convenience later.  YOU SHOULD EXPECT: Some feelings of bloating in the abdomen. Passage of more gas than usual.  Walking can help get rid of the air that was put into your GI tract during the procedure and reduce the bloating. If you had a lower endoscopy (such as a colonoscopy or flexible sigmoidoscopy) you may notice spotting of blood in your stool or on the toilet paper. If you underwent a bowel prep for your procedure, you may not have a normal bowel movement for a few days.  Please Note:  You might notice some irritation and congestion in your nose or some drainage.  This is from the oxygen used during your procedure.  There is no need for concern and it should clear up in a day or so.  SYMPTOMS TO REPORT IMMEDIATELY:   Following lower endoscopy (colonoscopy or flexible sigmoidoscopy):  Excessive amounts of blood in the stool  Significant tenderness or worsening of abdominal pains  Swelling of the abdomen that is new, acute  Fever of 100F or higher   For urgent or emergent issues, a gastroenterologist can be reached at any hour by calling [phone].   DIET: Your first meal following the procedure should be a small meal and then it is ok to progress to your normal diet. Heavy or fried foods are harder to digest and may make you feel nauseous or bloated.  Likewise, meals heavy in dairy and vegetables can increase bloating.  Drink plenty of fluids but you should avoid alcoholic beverages for 24 hours. Try to  increase the fiber in your diet.  ACTIVITY:  You should plan to take it easy for the rest of today and you should NOT DRIVE or use heavy machinery until tomorrow (because of the sedation medicines used during the test).    FOLLOW UP: Our staff will call the number listed on your records the next business day following your procedure to check on you and address any questions or concerns that you may have regarding the information given to you following your procedure. If we do not reach you, we will leave a message.  However, if you are feeling well and you are not experiencing any problems, there is no need to return our call.  We will assume that you have returned to your regular daily activities without incident.  If any biopsies were taken you will be contacted by phone or by letter within the next 1-3 weeks.  Please call us at [phone] if you have not heard about the biopsies in 3 weeks.    SIGNATURES/CONFIDENTIALITY: You and/or your care partner have signed paperwork which will be entered into your electronic medical record.  These signatures attest to the fact that that the information above on your After Visit Summary has been reviewed and is understood.  Full responsibility of the confidentiality of this discharge information lies with you and/or your care-partner.  Read all of the handouts given to you by your recovery room nurse. 

## 2015-04-03 NOTE — Progress Notes (Signed)
To recovery, report to Hodges, RN, VSS 

## 2015-04-03 NOTE — Op Note (Signed)
[place]  [address] [place], [id number]   COLONOSCOPY PROCEDURE REPORT  PATIENT: Dale Holmes, Dale Holmes  MR#: [id number] BIRTHDATE: 1950-12-11 , 74  yrs. old GENDER: male ENDOSCOPIST: Ladene Artist, MD, [facility] REFERRED JK:DTOIZTI Linna Darner, M.D. PROCEDURE DATE:  04/03/2015 PROCEDURE:   Colonoscopy, screening First Screening Colonoscopy - Avg.  risk and is 50 yrs.  old or older - No.  Prior Negative Screening - Now for repeat screening. 10 or more years since last screening  History of Adenoma - Now for follow-up colonoscopy & has been > or = to 3 yrs.  N/A ASA CLASS:   Class II INDICATIONS:Screening for colonic neoplasia and Colorectal Neoplasm Risk Assessment for this procedure is average risk. MEDICATIONS: Monitored anesthesia care and Propofol 300 mg IV DESCRIPTION OF PROCEDURE:   After the risks benefits and alternatives of the procedure were thoroughly explained, informed consent was obtained.  The digital rectal exam revealed no abnormalities of the rectum.   The LB [id number]  endoscope was introduced through the anus and advanced to the cecum, which was identified by both the appendix and ileocecal valve. No adverse events experienced.   The quality of the prep was good.  (Suprep was used)  The instrument was then slowly withdrawn as the colon was fully examined.    COLON FINDINGS: There was mild diverticulosis noted in the sigmoid colon.   The colonic mucosa appeared normal at the splenic flexure, in the transverse colon, rectum, descending colon, at the ileocecal valve, cecum, hepatic flexure, and in the ascending colon. Retroflexed views revealed no abnormalities. The time to cecum = 2.8 Withdrawal time = 13.9   The scope was withdrawn and the procedure completed. COMPLICATIONS: There were no immediate complications.  ENDOSCOPIC IMPRESSION: 1.   Mild diverticulosis in the sigmoid colon 2.   The colonoscopy otherwise appeared  normal  RECOMMENDATIONS: 1.  High fiber diet with liberal fluid intake. 2.  Continue current colorectal screening recommendations for "routine risk" patients with a repeat colonoscopy in 10 years.  eSigned:  Ladene Artist, MD, [facility] 04/03/2015 2:23 PM

## 2015-04-04 ENCOUNTER — Telehealth: Payer: Self-pay | Admitting: *Deleted

## 2015-04-04 NOTE — Telephone Encounter (Signed)
No answer. Name identifier. Message left to call if any questions or concerns. 

## 2015-05-03 DIAGNOSIS — R350 Frequency of micturition: Secondary | ICD-10-CM | POA: Diagnosis not present

## 2015-05-03 DIAGNOSIS — N401 Enlarged prostate with lower urinary tract symptoms: Secondary | ICD-10-CM | POA: Diagnosis not present

## 2015-05-04 ENCOUNTER — Other Ambulatory Visit (HOSPITAL_COMMUNITY): Payer: Self-pay | Admitting: Urology

## 2015-05-04 DIAGNOSIS — Z8042 Family history of malignant neoplasm of prostate: Secondary | ICD-10-CM

## 2015-05-08 ENCOUNTER — Encounter: Payer: Self-pay | Admitting: Internal Medicine

## 2015-05-08 LAB — URINALYSIS
BILIRUBIN URINE: 0 mg/dL
Bacteria: NONE SEEN
CRYSTALS: NONE SEEN
Casts: NONE SEEN
Glucose, Ur: NEGATIVE
Ketones, urine: NEGATIVE
Leukocyte Esterase: NEGATIVE
NITRITE UA: NEGATIVE
Protein, Ur: NEGATIVE
SPECIFIC GRAVITY, URINE: 1.025
UROBILINOGEN UA: NORMAL
WBC, UA: NONE SEEN
pH, Urine: 5

## 2015-05-09 ENCOUNTER — Encounter: Payer: Self-pay | Admitting: Internal Medicine

## 2015-05-17 ENCOUNTER — Ambulatory Visit (HOSPITAL_COMMUNITY)
Admission: RE | Admit: 2015-05-17 | Discharge: 2015-05-17 | Disposition: A | Discharge status: Home / Self Care | Payer: Medicare Other | Source: Ambulatory Visit | Attending: Urology | Admitting: Urology

## 2015-05-17 DIAGNOSIS — Z8042 Family history of malignant neoplasm of prostate: Secondary | ICD-10-CM | POA: Diagnosis not present

## 2015-05-17 DIAGNOSIS — N3289 Other specified disorders of bladder: Secondary | ICD-10-CM | POA: Diagnosis not present

## 2015-05-17 DIAGNOSIS — R972 Elevated prostate specific antigen [PSA]: Secondary | ICD-10-CM | POA: Diagnosis not present

## 2015-05-17 LAB — POCT I-STAT CREATININE: Creatinine, Ser: 1.2 mg/dL (ref 0.61–1.24)

## 2015-05-17 MED ORDER — GADOBENATE DIMEGLUMINE 529 MG/ML IV SOLN
20.0000 mL | Freq: Once | INTRAVENOUS | Status: AC | PRN
  Administered 2015-05-17: 18 mL via INTRAVENOUS

## 2015-05-31 ENCOUNTER — Encounter: Payer: Self-pay | Admitting: Internal Medicine

## 2015-05-31 ENCOUNTER — Other Ambulatory Visit: Payer: Self-pay | Admitting: Internal Medicine

## 2015-05-31 DIAGNOSIS — M25559 Pain in unspecified hip: Secondary | ICD-10-CM

## 2015-06-20 DIAGNOSIS — N401 Enlarged prostate with lower urinary tract symptoms: Secondary | ICD-10-CM | POA: Diagnosis not present

## 2015-06-20 DIAGNOSIS — R350 Frequency of micturition: Secondary | ICD-10-CM | POA: Diagnosis not present

## 2015-06-20 DIAGNOSIS — N138 Other obstructive and reflux uropathy: Secondary | ICD-10-CM | POA: Diagnosis not present

## 2015-06-22 ENCOUNTER — Ambulatory Visit (INDEPENDENT_AMBULATORY_CARE_PROVIDER_SITE_OTHER): Payer: Medicare Other | Admitting: Sports Medicine

## 2015-06-22 ENCOUNTER — Ambulatory Visit
Admission: RE | Admit: 2015-06-22 | Discharge: 2015-06-22 | Disposition: A | Discharge status: Home / Self Care | Payer: Medicare Other | Source: Ambulatory Visit | Attending: Sports Medicine | Admitting: Sports Medicine

## 2015-06-22 VITALS — BP 119/83 | Ht 74.0 in | Wt 190.0 lb

## 2015-06-22 DIAGNOSIS — M545 Low back pain, unspecified: Secondary | ICD-10-CM

## 2015-06-22 DIAGNOSIS — M5416 Radiculopathy, lumbar region: Secondary | ICD-10-CM

## 2015-06-22 DIAGNOSIS — M5136 Other intervertebral disc degeneration, lumbar region: Secondary | ICD-10-CM | POA: Diagnosis not present

## 2015-06-22 NOTE — Assessment & Plan Note (Addendum)
Patient with ongoing right posterior leg achiness and thigh circumference difference of 3 cm on the right compared to left. This is most likely coming from a lumbar radiculopathy and with evidence of flexion and adduction weakness along with hip extension is most likely is higher up in his lumbar spine. -We will obtain 2-3 view spine lumbar to evaluate for possible etiology. -Also gave him flexion extension abduction and adduction exercises to utilize on the right lower extremity  -He will follow-up in 6 weeks and we'll consider possible Neurontin low dose daily at bedtime if he is continuing to have symptoms. -Consider formal physical therapy but versus MRI depending on findings and follow-up exam.   Review of Xray Mild DDD of upper lumbar spine.  Not sure if enough to explain his sxs.  We will need to pursue MRI if not responding quickly to conservative therapy.  KBF

## 2015-06-22 NOTE — Progress Notes (Signed)
   Dale Holmes - 64 y.o. male MRN [id number]  Date of birth: Aug 18, 1951  Dale Holmes is a 64 y.o. who presents today for right posterior leg discomfort.  Right posterior leg discomfort, initial visit-patient is being referred by Dr. Linna Darner for ongoing right posterior leg discomfort. Patient states this is been going on for about 9 months now and is worse with flexion of the right leg. He denies any paresthesias or gross weakness at this time. Denies night time awakenings, bowel or bladder loss, fever, night sweats, chills. He has occasional low back pain but denies frank back pain that awakens him at night or is constant. He has noticed over the past several months that the discomfort in his right leg is worse with walking up hills or on an uneven surface. His main activity is walking and denies any type of low back injury right or right leg injury. He has not tried medication or any type of exercise program for this.  Now difficulty walking in golf 2/2 feeling week up hills.  Past Medical History  Diagnosis Date  . Hyperlipidemia 2007    NMR LDL goal = < 100   Surgical Hx - Non contributory  FHx - Prostate CA in brother  Meds - Zocor  Physical Exam Filed Vitals:   06/22/15 1013  BP: 119/83    Gen: NAD, Well nourished, Well developed Cardiorespiratory - Normal respiratory effort/rate.  Neuro: +2 patellar and achilles relfex b/l  Back/hip  Exam: No gross deformity of the lumbar spine. No tenderness palpation along the spinous processes or paraspinal musculature with no step-off deformity. Range of motion of the lumbar spine normal in all planes. Range of motion of the hip is limited internal rotation on the right. Muscle strength: Weakness of hip flexion extension abduction and abduction of the right side compared to the left. Atrophy of RT thigh (~ 2 cm) Negative straight leg raise, negative slump test Gait analysis-positive Trendelenburg on the right. 9. Vascular Exam : DP and PT  +2 B/L

## 2015-07-27 ENCOUNTER — Ambulatory Visit (INDEPENDENT_AMBULATORY_CARE_PROVIDER_SITE_OTHER): Payer: Medicare Other | Admitting: Sports Medicine

## 2015-07-27 ENCOUNTER — Encounter: Payer: Self-pay | Admitting: Sports Medicine

## 2015-07-27 VITALS — BP 107/69 | Ht 74.0 in | Wt 190.0 lb

## 2015-07-27 DIAGNOSIS — M5416 Radiculopathy, lumbar region: Secondary | ICD-10-CM

## 2015-07-27 DIAGNOSIS — S73191A Other sprain of right hip, initial encounter: Secondary | ICD-10-CM

## 2015-07-27 DIAGNOSIS — S76019A Strain of muscle, fascia and tendon of unspecified hip, initial encounter: Secondary | ICD-10-CM | POA: Insufficient documentation

## 2015-07-27 NOTE — Assessment & Plan Note (Signed)
Evidence of hypoechoic change of the gluteus medius muscle/tendon of the posterior lateral insertion on the greater trochanter under ultrasound. -We will have him start abduction exercises to help with the strength deficit and deficiency. -We can consider nitroglycerin patch to the area at next visit if he is seeing no improvement at that time.

## 2015-07-27 NOTE — Progress Notes (Signed)
   Dale Holmes - 64 y.o. male MRN [id number]  Date of birth: [birth year]  Dale Holmes is a 64 y.o. who presents today for right posterior leg discomfort f/u.  Right posterior leg discomfort, 06/22/15-patient is being referred by Dr. Linna Darner for ongoing right posterior leg discomfort. Patient states this is been going on for about 9 months now and is worse with flexion of the right leg. He denies any paresthesias or gross weakness at this time. Denies night time awakenings, bowel or bladder loss, fever, night sweats, chills. He has occasional low back pain but denies frank back pain that awakens him at night or is constant. He has noticed over the past several months that the discomfort in his right leg is worse with walking up hills or on an uneven surface. His main activity is walking and denies any type of low back injury right or right leg injury. He has not tried medication or any type of exercise program for this.  Visit 07/27/15 follow-up-patient being seen today for follow-up of his ongoing right hip/back pain. He had x-rays performed a lumbar spine at the end of July showing mild degenerative joint changes and slight foraminal stenosis. He is still complaining of lateral right hip pain and pain with any type of hip flexion. He has not seen an improvement since last visit but denies any new symptoms as well. No interval changes in his health   Past Medical History  Diagnosis Date  . Hyperlipidemia 2007    NMR LDL goal = < 100   Surgical Hx - Non contributory  FHx - Prostate CA in brother  Meds - Zocor  Physical Exam Filed Vitals:   07/27/15 1021  BP: 107/69    Gen: NAD, Well nourished, Well developed Cardiorespiratory - Normal respiratory effort/rate.  Neuro: +2 patellar and achilles relfex b/l  Back/hip  Exam: No gross deformity of the lumbar spine. No tenderness palpation along the spinous processes or paraspinal musculature with no step-off deformity. Range of motion of the  lumbar spine normal in all planes. Normal range of motion of the hip Muscle strength: Significance weakness of hip abduction on the right compared to the left. Otherwise muscle strength is symmetrical and 5/5 Atrophy of RT thigh (~ 3 cm) Negative straight leg raise, negative slump test Gait analysis-positive Trendelenburg on the right. Vascular Exam : DP and PT +2 B/L   Ultrasound: Transverse and longitudinal images were obtained of the lateral hip. The apex of the greater trochanter was visualized and the gluteus minimus tendon at the anterior facet appeared normal the lateral facet showed some hypoechoic change that was reflective in the gluteus medius muscle about 2-3 center meters proximal to the insertion. At this origin there was hypoechoic change in the muscle belly of the gluteus medius. This was followed proximally to the iliac crest insertion which appeared normal without hyperechoic osteophytosis. The IT band appeared normal going over the greater trochanteric. Gluteus maximus and piriformis were both identified and appeared normal fibrillar without hypoechoic changes are anechoic fluid.

## 2015-07-27 NOTE — Progress Notes (Signed)
Radiology Report: Transverse and longitudinal images were obtained of the lateral hip. The apex of the greater trochanter was visualized and the gluteus minimus tendon at the anterior facet appeared normal the lateral facet showed some hypoechoic change that was reflective in the gluteus medius muscle about 2-3 center meters proximal to the insertion. At this origin there was hypoechoic change in the muscle belly of the gluteus medius. This was followed proximally to the iliac crest insertion which appeared normal without hyperechoic osteophytosis. The IT band appeared normal going over the greater trochanteric. Gluteus maximus and piriformis were both identified and appeared normal fibrillar without hypoechoic changes are anechoic fluid.

## 2015-07-27 NOTE — Assessment & Plan Note (Signed)
Patient with lumbar radiculopathy secondary to degenerative joint disease from the lumbar spine. This has caused a circumferential difference of 3 cm of the right upper leg versus the left leg. -We will treat him for his underlying gluteus medius syndrome and see how he does in 6 weeks. -We can consider Neurontin, Tylenol, core strengthening/PT. -If he has not seen too much of improvement as well due to his underlying circumferential difference discrepancy, we will consider MRI of lumbar spine to look for nerve root impingement versus other underlying etiology.

## 2015-08-22 DIAGNOSIS — C61 Malignant neoplasm of prostate: Secondary | ICD-10-CM | POA: Diagnosis not present

## 2015-08-22 DIAGNOSIS — R972 Elevated prostate specific antigen [PSA]: Secondary | ICD-10-CM | POA: Diagnosis not present

## 2015-08-22 DIAGNOSIS — N401 Enlarged prostate with lower urinary tract symptoms: Secondary | ICD-10-CM | POA: Diagnosis not present

## 2015-08-22 HISTORY — PX: PROSTATE BIOPSY: SHX241

## 2015-08-22 HISTORY — DX: Malignant neoplasm of prostate: C61

## 2015-08-28 ENCOUNTER — Encounter: Payer: Self-pay | Admitting: Internal Medicine

## 2015-08-28 DIAGNOSIS — C61 Malignant neoplasm of prostate: Secondary | ICD-10-CM | POA: Diagnosis not present

## 2015-09-04 ENCOUNTER — Encounter: Payer: Self-pay | Admitting: Internal Medicine

## 2015-09-04 NOTE — Telephone Encounter (Signed)
Please advise 

## 2015-09-05 ENCOUNTER — Encounter: Payer: Self-pay | Admitting: Radiation Oncology

## 2015-09-05 NOTE — Progress Notes (Signed)
GU Location of Tumor / Histology: Adenocarcinoma of the Prostate    If Prostate Cancer, Gleason Score is (3+4 and PSA is (1.92 )  Dale Holmes presented to urology on 05/03/15 with a recent abnormal DRE on the left side of his prostate which was slightly larger with an irregular contour and suggstion of induration.  Identical twin brother Gleason 9.   Past/Anticipated interventions by urology, if any: Biopsy of Prostate  Past/Anticipated interventions by medical oncology, if any: Unknown  Weight changes, if any: None  Bowel/Bladder complaints, if any: Denies nocturia, hesistancy, urinary frequency, and no dysuria  Nausea/Vomiting, if any: No  Pain issues, if any:  No  SAFETY ISSUES:  Prior radiation? No  Pacemaker/ICD? No  Possible current pregnancy? N/A  Is the patient on methotrexate? No  Current Complaints / other details:

## 2015-09-06 ENCOUNTER — Ambulatory Visit
Admission: RE | Admit: 2015-09-06 | Discharge: 2015-09-06 | Disposition: A | Discharge status: Home / Self Care | Payer: Medicare Other | Source: Ambulatory Visit | Attending: Radiation Oncology | Admitting: Radiation Oncology

## 2015-09-06 ENCOUNTER — Encounter: Payer: Self-pay | Admitting: Radiation Oncology

## 2015-09-06 VITALS — BP 113/71 | HR 65 | Temp 97.6°F | Ht 74.0 in | Wt 195.5 lb

## 2015-09-06 DIAGNOSIS — Z79899 Other long term (current) drug therapy: Secondary | ICD-10-CM | POA: Insufficient documentation

## 2015-09-06 DIAGNOSIS — C61 Malignant neoplasm of prostate: Secondary | ICD-10-CM | POA: Diagnosis present

## 2015-09-06 DIAGNOSIS — E7800 Pure hypercholesterolemia, unspecified: Secondary | ICD-10-CM | POA: Diagnosis not present

## 2015-09-06 DIAGNOSIS — Z7982 Long term (current) use of aspirin: Secondary | ICD-10-CM | POA: Diagnosis not present

## 2015-09-06 DIAGNOSIS — E785 Hyperlipidemia, unspecified: Secondary | ICD-10-CM | POA: Diagnosis not present

## 2015-09-06 HISTORY — DX: Personal history of other malignant neoplasm of skin: Z85.828

## 2015-09-06 HISTORY — DX: Malignant neoplasm of prostate: C61

## 2015-09-06 HISTORY — DX: Other cervical disc displacement, unspecified cervical region: M50.20

## 2015-09-06 HISTORY — DX: Personal history of other endocrine, nutritional and metabolic disease: Z86.39

## 2015-09-06 HISTORY — DX: Personal history of other diseases of the circulatory system: Z86.79

## 2015-09-06 NOTE — Progress Notes (Signed)
[facility] NEW PATIENT EVALUATION  Name: Dale Holmes MRN: [id number]  Date:   09/06/2015           DOB: 19-Oct-1951  Status: outpatient   CC: Unice Cobble, MD  Carolan Clines, MD , Dr. Dutch Gray   REFERRING PHYSICIAN: Carolan Clines, MD   DIAGNOSIS: Clinical stage T2b intermediate risk adenocarcinoma prostate   HISTORY OF PRESENT ILLNESS:  Dale Holmes is a 64 y.o. male who is seen today through the courtesy of Dr. Gaynelle Arabian for evaluation of his clinical stage T2b intermediate risk adenocarcinoma prostate.  His identical twin brother was diagnosed with prostate cancer (Gleason score 9) and his now with metastatic disease.  Dr. Linna Darner  felt that the patient had an abnormal rectal examination, despite a normal PSA,  and he was referred to Dr. Gaynelle Arabian for further evaluation.  His PSA was 1.9 on 12/29/2014, and 2.5 prior to his biopsy on 08/22/2015.  His prostate volume was 18.2 mL.  He also noted an abnormal examination and obtained a MR scan the prostate on 05/17/2015 which showed a suspicious area within the medial right apex and also a less suspicious area along the medial left mid to apical gland.  He underwent ultrasound-guided biopsies on 08/22/2015.  He was found have Gleason 7 (3+4) involving 2 cores along the right apex and 60% of one core along the right lateral apex.  He had Gleason 6 (3+3) involving 60% of one core from the left lateral base, 10% of one core from the left base, 30% of one core from the right mid gland, 20% of one core from the right lateral base and 50% of one core from the right lateral mid gland. He is doing well from a GU and GI standpoint.  His I PSS score is 1.  He is potent.  He plans on seeing Dr. Alinda Money next week for discussion of robotic surgery.  PREVIOUS RADIATION THERAPY: No   PAST MEDICAL HISTORY:  has a past medical history of Hyperlipidemia (2007); Prostate cancer ([place]) (08/22/15); Herniated disc,  cervical; basal cell carcinoma; cardiac arrhythmia; hypercholesterolemia; and cardiac murmur.     PAST SURGICAL HISTORY:  Past Surgical History  Procedure Laterality Date  . Appendectomy    . Inguinal heriorrhaphy      R  . Colonoscopy  2004    negative; Dr Fuller Plan  . Prostate biopsy  08/22/2015     FAMILY HISTORY: family history includes Alcohol abuse in his brother; Colon cancer (age of onset: 73) in his father; Heart attack (age of onset: 71) in his father; Hypertension in his mother; Other in his brother; Prostate cancer in his brother and father; Transient ischemic attack (age of onset: 45) in his brother. There is no history of Diabetes.  His father was diagnosed with prostate cancer at age 3, died from complications of COPD at 79.  His mother died from an abdominal aortic aneurysm at 58.  No family history of breast cancer/BRCA positivity.   SOCIAL HISTORY:  reports that he has never smoked. He has never used smokeless tobacco. He reports that he drinks about 8.4 oz of alcohol per week. He reports that he does not use illicit drugs.  Married, 2 sons.  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   ALLERGIES: Review of patient's allergies indicates no known allergies.   MEDICATIONS:  Current Outpatient Prescriptions  Medication Sig Dispense Refill  . aspirin 81 MG tablet Take 81 mg by mouth daily.    Marland Kitchen  ibuprofen (ADVIL,MOTRIN) 200 MG tablet Take 200 mg by mouth every 6 (six) hours as needed.    . Multiple Vitamin (MULTIVITAMIN) tablet Take 1 tablet by mouth daily.    . simvastatin (ZOCOR) 20 MG tablet take 1 tablet by mouth once daily (Patient not taking: Reported on 03/27/2015) 15 tablet 0   No current facility-administered medications for this encounter.     REVIEW OF SYSTEMS:  Pertinent items are noted in HPI.    PHYSICAL EXAM:  height is 6' 2"  (1.88 m) and weight is 195 lb 8 oz (88.678 kg). His temperature is 97.6 F (36.4 C). His blood pressure is 113/71 and his pulse is  65.   Rectal examination: The prostate gland is normal in size.  There is raised induration along the right mid and apical regions but no definite periprostatic tumor extension.  I do not feel involvement of the seminal vesicles.   LABORATORY DATA:  Lab Results  Component Value Date   WBC 5.3 12/29/2014   HGB 15.3 12/29/2014   HCT 43.4 12/29/2014   MCV 88.5 12/29/2014   PLT 198.0 12/29/2014   Lab Results  Component Value Date   NA 139 12/29/2014   K 4.1 12/29/2014   CL 106 12/29/2014   CO2 25 12/29/2014   Lab Results  Component Value Date   ALT 20 12/29/2014   AST 21 12/29/2014   ALKPHOS 45 12/29/2014   BILITOT 0.7 12/29/2014   PSA 2.5 from 08/22/2015.   IMPRESSION: Clinical stage T2b intermediate risk adenocarcinoma prostate.  I explained to the patient and his wife that his prognosis is related to his stage, PSA level, and Gleason score.  His stage and PSA are favorable while his Gleason score of 7 is of intermediate favorability.  We discussed surgery versus active surveillance versus radiation therapy.  I do not recommend active surveillance.  Radiation therapy options include 5 weeks of external beam followed by a seed implant boost or 8 weeks of external beam/IMRT.  We discussed the potential acute and late toxicities of radiation therapy.  We also discussed the recently closed RTOG study looking at short-term androgen deprivation therapy for patients with intermediate risk disease.  With his identical twin having prostate cancer, and his father being diagnosed with prostate cancer, even age 62, there is probably some genetic predisposition.  Since radiation therapy is not an ablative procedure, I would argue that radiation therapy would not get rid of all of his prostate glands and he would be at a long-term risk for development of a new prostate cancer.  Therefore, I would recommend surgery over radiation therapy.  If he declines surgery then I would be more than happy to proceed  with radiation therapy.  Lastly, we discussed the need for screening of his sons at the appropriate age.  I wonder if genetic counseling would be appropriate.  I defer to Dr. Gaynelle Arabian and Dr. Alinda Money.  PLAN:  As discussed above.    I spent 60 minutes face to face with the patient and more than 50% of that time was spent in counseling and/or coordination of care.

## 2015-09-06 NOTE — Addendum Note (Signed)
Encounter addended by: Benn Moulder, RN on: 09/06/2015  3:34 PM<BR>     Documentation filed: Inpatient Patient Education, Flowsheet VN

## 2015-09-07 ENCOUNTER — Ambulatory Visit: Payer: Medicare Other | Admitting: Sports Medicine

## 2015-09-12 DIAGNOSIS — C61 Malignant neoplasm of prostate: Secondary | ICD-10-CM | POA: Diagnosis not present

## 2015-09-13 ENCOUNTER — Ambulatory Visit (INDEPENDENT_AMBULATORY_CARE_PROVIDER_SITE_OTHER): Payer: Medicare Other | Admitting: Sports Medicine

## 2015-09-13 ENCOUNTER — Encounter: Payer: Self-pay | Admitting: Sports Medicine

## 2015-09-13 VITALS — BP 114/67 | HR 67 | Ht 74.0 in | Wt 190.0 lb

## 2015-09-13 DIAGNOSIS — S73191D Other sprain of right hip, subsequent encounter: Secondary | ICD-10-CM | POA: Diagnosis not present

## 2015-09-13 DIAGNOSIS — R29898 Other symptoms and signs involving the musculoskeletal system: Secondary | ICD-10-CM | POA: Diagnosis not present

## 2015-09-13 NOTE — Progress Notes (Signed)
Dale Holmes - 64 y.o. male MRN [id number]  Date of birth: 04/16/1951  CC: f/u back pain  SUBJECTIVE:   HPI  Right posterior leg discomfort, 06/22/15-patient is being referred by Dr. Linna Darner for ongoing right posterior leg discomfort. Patient states this is been going on for about 9 months now and is worse with flexion of the right leg. He denies any paresthesias or gross weakness at this time. Denies night time awakenings, bowel or bladder loss, fever, night sweats, chills. He has occasional low back pain but denies frank back pain that awakens him at night or is constant. He has noticed over the past several months that the discomfort in his right leg is worse with walking up hills or on an uneven surface. His main activity is walking and denies any type of low back injury right or right leg injury. He has not tried medication or any type of exercise program for this.  Visit 07/27/15 follow-up-patient being seen today for follow-up of his ongoing right hip/back pain. He had x-rays performed a lumbar spine at the end of July showing mild degenerative joint changes and slight foraminal stenosis. He is still complaining of lateral right hip pain and pain with any type of hip flexion. He has not seen an improvement since last visit but denies any new symptoms as well. No interval changes in his health  Visit 09/13/2015:  Follow up for ongoing right hip/back pain and RLE weakness. Doing hip abductor strengthening exercises bid.  Feels much stronger.  His biggest issue remains his balance, especially on hills (golf course and beach).  No new symptoms, but has recently been diagnosed with a prostate cancer (gleason 6) and has undergone biopsy.  No fevers, chill, night sweats, weight loss.  No loss of bowel control, but some bladder issues s/p biopsy. He is leaning towards surgery at this point, which has been recommended due to a family history of prostate cancer. His strength is getting better, but balance remains  an issue.   ROS:     10 point RoS negative other than that listed in HPI above  HISTORY: Past Medical, Surgical, Social, and Family History Reviewed & Updated per EMR.  Pertinent Historical Findings include: Recently diagnosed with "low grade" prostate cancer.  Biopsy within last month.    OBJECTIVE: BP 114/67 mmHg  Pulse 67  Ht 6\' 2"  (1.88 m)  Wt 190 lb (86.183 kg)  BMI 24.38 kg/m2  Physical Exam  Gen: NAD, Well nourished, Well developed Cardiorespiratory - Normal respiratory effort/rate.  Neuro: +2 patellar and achilles relfex b/l, potentially increased on the right Back/hip Exam: No gross deformity of the lumbar spine. No tenderness palpation along the spinous processes or paraspinal musculature with no step-off deformity. Range of motion of the lumbar spine normal in all planes. Normal range of motion of the hip Muscle strength: Mild weakness of hip abduction/flexion on the right compared to the left. Otherwise muscle strength is symmetrical and 5/5 Atrophy of RT thigh (~ 3 cm ->1cm today) Negative straight leg raise, negative slump test Gait analysis-positive Trendelenburg on the right. Vascular Exam : DP and PT +2 B/L   Gait: minimal hip flexion on the right side.    MEDICATIONS, LABS & OTHER ORDERS: Previous Medications   ASPIRIN 81 MG TABLET    Take 81 mg by mouth daily.   IBUPROFEN (ADVIL,MOTRIN) 200 MG TABLET    Take 200 mg by mouth every 6 (six) hours as needed.   MULTIPLE VITAMIN (MULTIVITAMIN)  TABLET    Take 1 tablet by mouth daily.   SIMVASTATIN (ZOCOR) 20 MG TABLET    take 1 tablet by mouth once daily   Modified Medications   No medications on file   New Prescriptions   No medications on file   Discontinued Medications   No medications on file  No orders of the defined types were placed in this encounter.   ASSESSMENT & PLAN: See problem based charting & AVS for pt instructions.

## 2015-09-13 NOTE — Assessment & Plan Note (Signed)
Hip abductor strength improved on exam today. Doing exercises bid.

## 2015-09-13 NOTE — Assessment & Plan Note (Signed)
Gleason 6 prostate cancer with family history of aggressive prostate cancer.  Considering surgery.  Also with hip flexor in addition to previously identified hip abductor weakness. Balance on hilly ground remains an issue.  Questionable hyperreflexia of right lower extremity. Will obtain MRI of the lumbar spine to evaluate these abnormalities.  - MRI of lumbar spine - Continue with hip abductor exercises and begin leg lifts.  - f/u after MRI.

## 2015-09-21 ENCOUNTER — Other Ambulatory Visit: Payer: Self-pay | Admitting: Urology

## 2015-10-02 ENCOUNTER — Ambulatory Visit
Admission: RE | Admit: 2015-10-02 | Discharge: 2015-10-02 | Disposition: A | Discharge status: Home / Self Care | Payer: Medicare Other | Source: Ambulatory Visit | Attending: Sports Medicine | Admitting: Sports Medicine

## 2015-10-02 DIAGNOSIS — R29898 Other symptoms and signs involving the musculoskeletal system: Secondary | ICD-10-CM

## 2015-10-02 DIAGNOSIS — M4806 Spinal stenosis, lumbar region: Secondary | ICD-10-CM | POA: Diagnosis not present

## 2015-10-11 ENCOUNTER — Ambulatory Visit (INDEPENDENT_AMBULATORY_CARE_PROVIDER_SITE_OTHER): Payer: Medicare Other | Admitting: Sports Medicine

## 2015-10-11 ENCOUNTER — Encounter: Payer: Self-pay | Admitting: Sports Medicine

## 2015-10-11 VITALS — BP 111/64 | HR 66 | Ht 74.0 in | Wt 190.0 lb

## 2015-10-11 DIAGNOSIS — M4806 Spinal stenosis, lumbar region: Secondary | ICD-10-CM | POA: Diagnosis present

## 2015-10-11 DIAGNOSIS — M48061 Spinal stenosis, lumbar region without neurogenic claudication: Secondary | ICD-10-CM | POA: Insufficient documentation

## 2015-10-11 MED ORDER — GABAPENTIN 300 MG PO CAPS
ORAL_CAPSULE | ORAL | Status: DC
Start: 2015-10-11 — End: 2015-12-08

## 2015-10-11 NOTE — Progress Notes (Signed)
Dale Holmes - 64 y.o. male MRN [id number]  Date of birth: 1950-12-09  CC: MRI review  SUBJECTIVE:   HPI Right posterior leg discomfort, 06/22/15-patient is being referred by Dr. Linna Darner for ongoing right posterior leg discomfort. Patient states this is been going on for about 9 months now and is worse with flexion of the right leg. He denies any paresthesias or gross weakness at this time. Denies night time awakenings, bowel or bladder loss, fever, night sweats, chills. He has occasional low back pain but denies frank back pain that awakens him at night or is constant. He has noticed over the past several months that the discomfort in his right leg is worse with walking up hills or on an uneven surface. His main activity is walking and denies any type of low back injury right or right leg injury. He has not tried medication or any type of exercise program for this.  Visit 07/27/15 follow-up-patient being seen today for follow-up of his ongoing right hip/back pain. He had x-rays performed a lumbar spine at the end of July showing mild degenerative joint changes and slight foraminal stenosis. He is still complaining of lateral right hip pain and pain with any type of hip flexion. He has not seen an improvement since last visit but denies any new symptoms as well. No interval changes in his health  Visit 09/13/2015:  Follow up for ongoing right hip/back pain and RLE weakness. Doing hip abductor strengthening exercises bid. Feels much stronger. His biggest issue remains his balance, especially on hills (golf course and beach). No new symptoms, but has recently been diagnosed with a prostate cancer (gleason 6) and has undergone biopsy. No fevers, chill, night sweats, weight loss. No loss of bowel control, but some bladder issues s/p biopsy. He is leaning towards surgery at this point, which has been recommended due to a family history of prostate cancer. His strength is getting better, but balance remains an  issue.   10/11/2015: His symptoms have not significantly changed.  As above he has Gleason 6 prostate cancer and is having surgery in ~ 1 month, 11/09/2015.  He again notes left proximal leg weakness and lateral hip stiffness, especially several miles into a long hike this past weekend. He has no radicular pain, but does report ill defined left posterior calf "wetness/coldness", which is exacerbated by lying down.  He again denies red flag symptoms.  Balance continues to be an issues when walking the golf course, especially descending hills.    Lumbar MRI from 10/02/2015 reviewed:  Shows small left lateral disc protrusion L1-2 & L2-3. Mild associated spinal stenosis at L2-3. Mild spinal stenosis and mild right foraminal stenosis L3-4. Small central disc protrusion L4-5 with mild spinal stenosis. Moderate left foraminal encroachment at L4-5.    ROS:     10 point RoS negative other than that listed in HPI above.   HISTORY: Past Medical, Surgical, Social, and Family History Reviewed & Updated per EMR.  Pertinent Historical Findings include: Recently diagnosed with "low grade" prostate cancer. Surgery scheduled for next month.   OBJECTIVE: BP 111/64 mmHg  Pulse 66  Ht 6\' 2"  (1.88 m)  Wt 190 lb (86.183 kg)  BMI 24.38 kg/m2  Physical Exam  Calm, NAD Non-labored breathing Neuro: +2 patellar and achilles relfex b/l Back/hip Exam: No gross deformity of the lumbar spine. No tenderness palpation along the spinous processes or paraspinal musculature with no step-off deformity. Range of motion of the lumbar spine normal in all  planes. Normal range of motion of the hip b/l.  Muscle strength: Mild weakness of hip abduction/flexion on the right compared to the left. Otherwise muscle strength is symmetrical and 5/5 Atrophy of RT thigh (~ 3 cm ->1cm -> 0.5cm today) Negative straight leg raise, negative slump test Gait analysis-positive Trendelenburg on the right. Vascular Exam : DP and PT +2 B/L    Gait: minimal hip flexion on the right side.     MEDICATIONS, LABS & OTHER ORDERS: Previous Medications   ASPIRIN 81 MG TABLET    Take 81 mg by mouth daily.   IBUPROFEN (ADVIL,MOTRIN) 200 MG TABLET    Take 200 mg by mouth every 6 (six) hours as needed.   MULTIPLE VITAMIN (MULTIVITAMIN) TABLET    Take 1 tablet by mouth daily.   SIMVASTATIN (ZOCOR) 20 MG TABLET    take 1 tablet by mouth once daily   Modified Medications   No medications on file   New Prescriptions   No medications on file   Discontinued Medications   No medications on file  No orders of the defined types were placed in this encounter.   ASSESSMENT & PLAN: See problem based charting & AVS for pt instructions.

## 2015-10-11 NOTE — Patient Instructions (Signed)
Take this nerve calming agent, Gabapentin: - Start with 300mg  every night - After 5 days take morning and night - 5 days later take 300mg  every 8 hours.  Please let us know if you have any abnormal side effects.   Continue with your home exercises.   We will plan to do formal physical therapy after surgery.

## 2015-10-11 NOTE — Assessment & Plan Note (Signed)
Mild lumbar stenosis on 09/2015 MRI. He has mild-moderate right proximal leg weakness, but no dermatomal symptomology. Leg weakness worsens with prolonged walking.  Minimal hip flexion with gait, causing him to swing his leg.  His left posterior calf numbness is a "wetness". No pain.   - We will start him on an increasing dose of gabapentin, increasing from 300 mg qHS to 300mg  tid,  over the next 2+ weeks.  - He will continue with his HEP.  - Likely would benefit from formal PT following his prostate surgery rehab.  - Discussed red flag symptoms.  - f/u 3+ months after prostate surgery rehab, sooner if needed.

## 2015-10-23 DIAGNOSIS — R278 Other lack of coordination: Secondary | ICD-10-CM | POA: Diagnosis not present

## 2015-10-23 DIAGNOSIS — C61 Malignant neoplasm of prostate: Secondary | ICD-10-CM | POA: Diagnosis not present

## 2015-10-23 DIAGNOSIS — M62838 Other muscle spasm: Secondary | ICD-10-CM | POA: Diagnosis not present

## 2015-10-23 DIAGNOSIS — M6281 Muscle weakness (generalized): Secondary | ICD-10-CM | POA: Diagnosis not present

## 2015-10-24 DIAGNOSIS — C61 Malignant neoplasm of prostate: Secondary | ICD-10-CM | POA: Diagnosis not present

## 2015-10-30 DIAGNOSIS — R278 Other lack of coordination: Secondary | ICD-10-CM | POA: Diagnosis not present

## 2015-10-30 DIAGNOSIS — M62838 Other muscle spasm: Secondary | ICD-10-CM | POA: Diagnosis not present

## 2015-10-30 DIAGNOSIS — N138 Other obstructive and reflux uropathy: Secondary | ICD-10-CM | POA: Diagnosis not present

## 2015-10-30 DIAGNOSIS — M6281 Muscle weakness (generalized): Secondary | ICD-10-CM | POA: Diagnosis not present

## 2015-10-30 DIAGNOSIS — N401 Enlarged prostate with lower urinary tract symptoms: Secondary | ICD-10-CM | POA: Diagnosis not present

## 2015-11-03 NOTE — Patient Instructions (Addendum)
Dale Holmes  11/03/2015   Your procedure is scheduled on:   11/09/2015    Report to [facility]  Entrance take [place]  elevators to 3rd floor to  [place] at      [place] AM.  Call this number if you have problems the morning of surgery [phone]   Remember: ONLY 1 PERSON MAY GO WITH YOU TO SHORT STAY TO GET  READY MORNING OF YOUR SURGERY.  Do not eat food or drink liquids :After Midnight.     Take these medicines the morning of surgery with A SIP OF WATER: none                                 You may not have any metal on your body including hair pins and              piercings  Do not wear jewelry,  lotions, powders or perfumes, deodorant         .              Men may shave face and neck.   Do not bring valuables to the hospital. [place].  Contacts, dentures or bridgework may not be worn into surgery.  Leave suitcase in the car. After surgery it may be brought to your room.     Special Instructions: coughing and deep breathing exercises, leg exercises `              Please read over the following fact sheets you were given: _____________________________________________________________________             [facility] - Preparing for Surgery Before surgery, you can play an important role.  Because skin is not sterile, your skin needs to be as free of germs as possible.  You can reduce the number of germs on your skin by washing with CHG (chlorahexidine gluconate) soap before surgery.  CHG is an antiseptic cleaner which kills germs and bonds with the skin to continue killing germs even after washing. Please DO NOT use if you have an allergy to CHG or antibacterial soaps.  If your skin becomes reddened/irritated stop using the CHG and inform your nurse when you arrive at Short Stay. Do not shave (including legs and underarms) for at least 48 hours prior to the first CHG shower.  You may shave  your face/neck. Please follow these instructions carefully:  1.  Shower with CHG Soap the night before surgery and the  morning of Surgery.  2.  If you choose to wash your hair, wash your hair first as usual with your  normal  shampoo.  3.  After you shampoo, rinse your hair and body thoroughly to remove the  shampoo.                           4.  Use CHG as you would any other liquid soap.  You can apply chg directly  to the skin and wash                       Gently with a scrungie or clean washcloth.  5.  Apply the  CHG Soap to your body ONLY FROM THE NECK DOWN.   Do not use on face/ open                           Wound or open sores. Avoid contact with eyes, ears mouth and genitals (private parts).                       Wash face,  Genitals (private parts) with your normal soap.             6.  Wash thoroughly, paying special attention to the area where your surgery  will be performed.  7.  Thoroughly rinse your body with warm water from the neck down.  8.  DO NOT shower/wash with your normal soap after using and rinsing off  the CHG Soap.                9.  Pat yourself dry with a clean towel.            10.  Wear clean pajamas.            11.  Place clean sheets on your bed the night of your first shower and do not  sleep with pets. Day of Surgery : Do not apply any lotions/deodorants the morning of surgery.  Please wear clean clothes to the hospital/surgery center.  FAILURE TO FOLLOW THESE INSTRUCTIONS MAY RESULT IN THE CANCELLATION OF YOUR SURGERY PATIENT SIGNATURE_________________________________  NURSE SIGNATURE__________________________________  ________________________________________________________________________  WHAT IS A BLOOD TRANSFUSION? Blood Transfusion Information  A transfusion is the replacement of blood or some of its parts. Blood is made up of multiple cells which provide different functions.  Red blood cells carry oxygen and are used for blood loss  replacement.  White blood cells fight against infection.  Platelets control bleeding.  Plasma helps clot blood.  Other blood products are available for specialized needs, such as hemophilia or other clotting disorders. BEFORE THE TRANSFUSION  Who gives blood for transfusions?   Healthy volunteers who are fully evaluated to make sure their blood is safe. This is blood bank blood. Transfusion therapy is the safest it has ever been in the practice of medicine. Before blood is taken from a donor, a complete history is taken to make sure that person has no history of diseases nor engages in risky social behavior (examples are intravenous drug use or sexual activity with multiple partners). The donor's travel history is screened to minimize risk of transmitting infections, such as malaria. The donated blood is tested for signs of infectious diseases, such as HIV and hepatitis. The blood is then tested to be sure it is compatible with you in order to minimize the chance of a transfusion reaction. If you or a relative donates blood, this is often done in anticipation of surgery and is not appropriate for emergency situations. It takes many days to process the donated blood. RISKS AND COMPLICATIONS Although transfusion therapy is very safe and saves many lives, the main dangers of transfusion include:  1. Getting an infectious disease. 2. Developing a transfusion reaction. This is an allergic reaction to something in the blood you were given. Every precaution is taken to prevent this. The decision to have a blood transfusion has been considered carefully by your caregiver before blood is given. Blood is not given unless the benefits outweigh the risks. AFTER THE TRANSFUSION  Right after receiving a blood transfusion,  you will usually feel much better and more energetic. This is especially true if your red blood cells have gotten low (anemic). The transfusion raises the level of the red blood cells which  carry oxygen, and this usually causes an energy increase.  The nurse administering the transfusion will monitor you carefully for complications. HOME CARE INSTRUCTIONS  No special instructions are needed after a transfusion. You may find your energy is better. Speak with your caregiver about any limitations on activity for underlying diseases you may have. SEEK MEDICAL CARE IF:   Your condition is not improving after your transfusion.  You develop redness or irritation at the intravenous (IV) site. SEEK IMMEDIATE MEDICAL CARE IF:  Any of the following symptoms occur over the next 12 hours:  Shaking chills.  You have a temperature by mouth above 102 F (38.9 C), not controlled by medicine.  Chest, back, or muscle pain.  People around you feel you are not acting correctly or are confused.  Shortness of breath or difficulty breathing.  Dizziness and fainting.  You get a rash or develop hives.  You have a decrease in urine output.  Your urine turns a dark color or changes to pink, red, or brown. Any of the following symptoms occur over the next 10 days:  You have a temperature by mouth above 102 F (38.9 C), not controlled by medicine.  Shortness of breath.  Weakness after normal activity.  The white part of the eye turns yellow (jaundice).  You have a decrease in the amount of urine or are urinating less often.  Your urine turns a dark color or changes to pink, red, or brown. Document Released: 11/08/2000 Document Revised: 02/03/2012 Document Reviewed: 06/27/2008 ExitCare Patient Information 2014 [place].  _______________________________________________________________________  Incentive Spirometer  An incentive spirometer is a tool that can help keep your lungs clear and active. This tool measures how well you are filling your lungs with each breath. Taking long deep breaths may help reverse or decrease the chance of developing breathing (pulmonary) problems  (especially infection) following:  A long period of time when you are unable to move or be active. BEFORE THE PROCEDURE   If the spirometer includes an indicator to show your best effort, your nurse or respiratory therapist will set it to a desired goal.  If possible, sit up straight or lean slightly forward. Try not to slouch.  Hold the incentive spirometer in an upright position. INSTRUCTIONS FOR USE  3. Sit on the edge of your bed if possible, or sit up as far as you can in bed or on a chair. 4. Hold the incentive spirometer in an upright position. 5. Breathe out normally. 6. Place the mouthpiece in your mouth and seal your lips tightly around it. 7. Breathe in slowly and as deeply as possible, raising the piston or the ball toward the top of the column. 8. Hold your breath for 3-5 seconds or for as long as possible. Allow the piston or ball to fall to the bottom of the column. 9. Remove the mouthpiece from your mouth and breathe out normally. 10. Rest for a few seconds and repeat Steps 1 through 7 at least 10 times every 1-2 hours when you are awake. Take your time and take a few normal breaths between deep breaths. 11. The spirometer may include an indicator to show your best effort. Use the indicator as a goal to work toward during each repetition. 12. After each set of 10 deep breaths,  practice coughing to be sure your lungs are clear. If you have an incision (the cut made at the time of surgery), support your incision when coughing by placing a pillow or rolled up towels firmly against it. Once you are able to get out of bed, walk around indoors and cough well. You may stop using the incentive spirometer when instructed by your caregiver.  RISKS AND COMPLICATIONS  Take your time so you do not get dizzy or light-headed.  If you are in pain, you may need to take or ask for pain medication before doing incentive spirometry. It is harder to take a deep breath if you are having  pain. AFTER USE  Rest and breathe slowly and easily.  It can be helpful to keep track of a log of your progress. Your caregiver can provide you with a simple table to help with this. If you are using the spirometer at home, follow these instructions: [place] IF:   You are having difficultly using the spirometer.  You have trouble using the spirometer as often as instructed.  Your pain medication is not giving enough relief while using the spirometer.  You develop fever of 100.5 F (38.1 C) or higher. SEEK IMMEDIATE MEDICAL CARE IF:   You cough up bloody sputum that had not been present before.  You develop fever of 102 F (38.9 C) or greater.  You develop worsening pain at or near the incision site. MAKE SURE YOU:   Understand these instructions.  Will watch your condition.  Will get help right away if you are not doing well or get worse. Document Released: 03/24/2007 Document Revised: 02/03/2012 Document Reviewed: 05/25/2007 [facility] Patient Information 2014 [place], [place].   ________________________________________________________________________

## 2015-11-06 ENCOUNTER — Ambulatory Visit (HOSPITAL_COMMUNITY)
Admission: RE | Admit: 2015-11-06 | Discharge: 2015-11-06 | Disposition: A | Discharge status: Home / Self Care | Payer: Medicare Other | Source: Ambulatory Visit | Attending: Urology | Admitting: Urology

## 2015-11-06 ENCOUNTER — Encounter (HOSPITAL_COMMUNITY)
Admission: RE | Admit: 2015-11-06 | Discharge: 2015-11-06 | Disposition: A | Discharge status: Home / Self Care | Payer: Medicare Other | Source: Ambulatory Visit | Attending: Urology | Admitting: Urology

## 2015-11-06 ENCOUNTER — Encounter (HOSPITAL_COMMUNITY): Payer: Self-pay

## 2015-11-06 DIAGNOSIS — Z01818 Encounter for other preprocedural examination: Secondary | ICD-10-CM | POA: Diagnosis not present

## 2015-11-06 DIAGNOSIS — C61 Malignant neoplasm of prostate: Secondary | ICD-10-CM | POA: Diagnosis not present

## 2015-11-06 HISTORY — DX: Unspecified malignant neoplasm of skin, unspecified: C44.90

## 2015-11-06 HISTORY — DX: Cardiac arrhythmia, unspecified: I49.9

## 2015-11-06 LAB — BASIC METABOLIC PANEL
ANION GAP: 6 (ref 5–15)
BUN: 20 mg/dL (ref 6–20)
CALCIUM: 8.9 mg/dL (ref 8.9–10.3)
CO2: 26 mmol/L (ref 22–32)
Chloride: 110 mmol/L (ref 101–111)
Creatinine, Ser: 1.03 mg/dL (ref 0.61–1.24)
Glucose, Bld: 96 mg/dL (ref 65–99)
Potassium: 4.5 mmol/L (ref 3.5–5.1)
SODIUM: 142 mmol/L (ref 135–145)

## 2015-11-06 LAB — CBC
HCT: 42.4 % (ref 39.0–52.0)
HEMOGLOBIN: 15 g/dL (ref 13.0–17.0)
MCH: 31.2 pg (ref 26.0–34.0)
MCHC: 35.4 g/dL (ref 30.0–36.0)
MCV: 88.1 fL (ref 78.0–100.0)
PLATELETS: 193 10*3/uL (ref 150–400)
RBC: 4.81 MIL/uL (ref 4.22–5.81)
RDW: 12.8 % (ref 11.5–15.5)
WBC: 5.6 10*3/uL (ref 4.0–10.5)

## 2015-11-06 LAB — ABO/RH: ABO/RH(D): O POS

## 2015-11-06 NOTE — Progress Notes (Signed)
Final EKG done 11/06/15 in [facility].

## 2015-11-06 NOTE — Progress Notes (Signed)
CXR done 11/06/15 faxed via EPIC to Dr Alinda Money.

## 2015-11-07 ENCOUNTER — Other Ambulatory Visit (HOSPITAL_COMMUNITY): Payer: Medicare Other

## 2015-11-07 DIAGNOSIS — R918 Other nonspecific abnormal finding of lung field: Secondary | ICD-10-CM | POA: Diagnosis not present

## 2015-11-07 DIAGNOSIS — J90 Pleural effusion, not elsewhere classified: Secondary | ICD-10-CM | POA: Diagnosis not present

## 2015-11-08 NOTE — H&P (Signed)
Chief Complaint Prostate Cancer     History of Present Illness Mr. Braatz is a 64 year old gentleman who has an identical twin brother with a history of Gleason 9 prostate cancer. He also has a father with a history of prostate cancer. He was found to have left sided prostate induration by Dr. Linna Darner prompting a urologic evaluation by Dr. Gaynelle Arabian. His PSA was 2.5. He underwent an MRI of the prostate on 05/17/15 which demonstrated a hypointense lesion at the right medial apex that also showed restricted diffusion raising concern for higher grade prostate cancer. No bony abnormalities, lymphadenopathy, or evidence of SVI or EPE was noted. A TRUS biopsy was performed on 08/22/15 and this confirmed Gleason 3+4=7 adenocarcinoma with 8 out of 13 biopsy cores positive for malignancy and the highest grade areas correlating to the right apical region as noted on the MRI.     TNM stage: cT2a N0 Mx  Gleason score: 3+4=7  PSA: 2.5  Biopsy (08/22/15): 8/13 cores positive    Left: L lateral base (60%, 3+3=6), L base (10%, 3+3=6)    Right: R apex (2 cores positive - 50% and 50%, 3+4=7), R lateral apex (60%, 3+4=7), R mid (30%, 3+3=6), R lateral mid (50%, 3+3=6), R lateral base (20%, 3+3=6)  Prostate volume: 18.5 cc    Nomogram  OC disease: 35%  EPE: 65%  SVI: 7%  LNI: 6%  PFS (surgery): 90% at 5 years, 83% at 8 years    Urinary function: IPSS is 1.  Erectile function: SHIM score is 25.   Past Medical History Problems  1. History of Herniated Cervical Disc 2. History of basal cell carcinoma (Z85.828) 3. History of hypercholesterolemia (Z86.39)  Surgical History Problems  1. History of Appendectomy 2. History of Hernia Repair  His appendectomy was performed at age 28 via a right lower quadrant incision. His hernia repair was performed as a 11-year-old. It was an inguinal hernia.   Current Meds 1. Aspirin 81 MG Oral Tablet Chewable;  Therapy: (Recorded:05Apr2010) to  Recorded 2. Ibuprofen CAPS;  Therapy: (Recorded:08Jun2016) to Recorded 3. Multi-Vitamin TABS;  Therapy: (Recorded:05Apr2010) to Recorded  Allergies Medication  1. No Known Drug Allergies  Family History Problems  1. Family history of Death In The Family Mother   74 2. Family history of Deceased : Father 3. Family history of Family Health Status Number Of Children   2 sons 4. Family history of prostate cancer in father (Z67.42) : Father 5. Family history of prostate carcinoma (Z80.42) : Brother  His father was diagnosed with prostate cancer at age 85 and underwent a radical prostatectomy for treatment. He lived until age 34 when he died of unrelated causes.    His brother was diagnosed with metastatic, Gleason 9 prostate cancer approximately 7-8 years ago and currently is on systemic therapy.   Social History Problems    Alcohol Use (History)   2 per day   Marital History - Currently Married   Never a smoker   Occupation:   Personal assistant   Denied: History of Tobacco Use  Review of Systems Genitourinary, constitutional, skin, eye, otolaryngeal, hematologic/lymphatic, cardiovascular, pulmonary, endocrine, musculoskeletal, gastrointestinal, neurological and psychiatric system(s) were reviewed and pertinent findings if present are noted and are otherwise negative.     Physical Exam Constitutional: Well nourished and well developed . No acute distress.  ENT:. The ears and nose are normal in appearance.  Neck: The appearance of the neck is normal and no neck mass is present.  Pulmonary:  No respiratory distress, normal respiratory rhythm and effort and clear bilateral breath sounds.  Cardiovascular: Heart rate and rhythm are normal . No peripheral edema.  Abdomen: right lower quadrant incision site(s) well healed. The abdomen is soft and nontender. No masses are palpated. No CVA tenderness. No hernias are palpable. No hepatosplenomegaly noted.   Assessment Assessed  1.  Prostate cancer (C61)   Discussion/Summary 1. Prostate cancer: He has elected surgical therapy and will undergo a bilateral nerve sparing robot-assisted laparoscopic radical prostatectomy and bilateral pelvic lymphadenectomy.

## 2015-11-09 ENCOUNTER — Inpatient Hospital Stay (HOSPITAL_COMMUNITY)
Admission: RE | Admit: 2015-11-09 | Discharge: 2015-11-10 | DRG: 708 | Disposition: A | Discharge status: Home / Self Care | Payer: Medicare Other | Source: Ambulatory Visit | Attending: Urology | Admitting: Urology

## 2015-11-09 ENCOUNTER — Encounter (HOSPITAL_COMMUNITY): Payer: Self-pay | Admitting: *Deleted

## 2015-11-09 ENCOUNTER — Inpatient Hospital Stay (HOSPITAL_COMMUNITY): Payer: Medicare Other | Admitting: Certified Registered"

## 2015-11-09 ENCOUNTER — Encounter (HOSPITAL_COMMUNITY)
Admission: RE | Disposition: A | Discharge status: Home / Self Care | Payer: Self-pay | Source: Ambulatory Visit | Attending: Urology

## 2015-11-09 DIAGNOSIS — C61 Malignant neoplasm of prostate: Principal | ICD-10-CM | POA: Diagnosis present

## 2015-11-09 HISTORY — PX: LYMPHADENECTOMY: SHX5960

## 2015-11-09 HISTORY — PX: ROBOT ASSISTED LAPAROSCOPIC RADICAL PROSTATECTOMY: SHX5141

## 2015-11-09 LAB — TYPE AND SCREEN
ABO/RH(D): O POS
ANTIBODY SCREEN: NEGATIVE

## 2015-11-09 LAB — HEMOGLOBIN AND HEMATOCRIT, BLOOD
HCT: 41 % (ref 39.0–52.0)
Hemoglobin: 13.8 g/dL (ref 13.0–17.0)

## 2015-11-09 SURGERY — ROBOTIC ASSISTED LAPAROSCOPIC RADICAL PROSTATECTOMY LEVEL 2
Anesthesia: General

## 2015-11-09 MED ORDER — LACTATED RINGERS IV SOLN
INTRAVENOUS | Status: DC | PRN
  Administered 2015-11-09: 08:00:00

## 2015-11-09 MED ORDER — HYDROMORPHONE HCL 1 MG/ML IJ SOLN: 0.2500 mg | INTRAMUSCULAR | Status: DC | PRN

## 2015-11-09 MED ORDER — HYDROMORPHONE HCL 1 MG/ML IJ SOLN
INTRAMUSCULAR | Status: DC | PRN
  Administered 2015-11-09 (×2): 1 mg via INTRAVENOUS

## 2015-11-09 MED ORDER — SODIUM CHLORIDE 0.9 % IR SOLN
Status: DC | PRN
  Administered 2015-11-09: 1 via INTRAVESICAL

## 2015-11-09 MED ORDER — CEFAZOLIN SODIUM-DEXTROSE 2-3 GM-% IV SOLR
INTRAVENOUS | Status: AC
  Filled 2015-11-09: qty 50

## 2015-11-09 MED ORDER — MORPHINE SULFATE (PF) 2 MG/ML IV SOLN: 2.0000 mg | INTRAVENOUS | Status: DC | PRN

## 2015-11-09 MED ORDER — ONDANSETRON HCL 4 MG/2ML IJ SOLN
INTRAMUSCULAR | Status: AC
  Filled 2015-11-09: qty 2

## 2015-11-09 MED ORDER — HYDROMORPHONE HCL 2 MG/ML IJ SOLN
INTRAMUSCULAR | Status: AC
  Filled 2015-11-09: qty 1

## 2015-11-09 MED ORDER — DIPHENHYDRAMINE HCL 12.5 MG/5ML PO ELIX
12.5000 mg | ORAL_SOLUTION | Freq: Four times a day (QID) | ORAL | Status: DC | PRN
Start: 2015-11-09 — End: 2015-11-10

## 2015-11-09 MED ORDER — BUPIVACAINE-EPINEPHRINE (PF) 0.25% -1:200000 IJ SOLN
INTRAMUSCULAR | Status: AC
  Filled 2015-11-09: qty 30

## 2015-11-09 MED ORDER — STERILE WATER FOR IRRIGATION IR SOLN
Status: DC | PRN
  Administered 2015-11-09: 1000 mL

## 2015-11-09 MED ORDER — EPHEDRINE SULFATE 50 MG/ML IJ SOLN
INTRAMUSCULAR | Status: AC
  Filled 2015-11-09: qty 1

## 2015-11-09 MED ORDER — SUCCINYLCHOLINE CHLORIDE 20 MG/ML IJ SOLN
INTRAMUSCULAR | Status: DC | PRN
  Administered 2015-11-09: 100 mg via INTRAVENOUS

## 2015-11-09 MED ORDER — LACTATED RINGERS IV SOLN: INTRAVENOUS | Status: DC

## 2015-11-09 MED ORDER — HEPARIN SODIUM (PORCINE) 1000 UNIT/ML IJ SOLN
INTRAMUSCULAR | Status: AC
  Filled 2015-11-09: qty 1

## 2015-11-09 MED ORDER — DEXAMETHASONE SODIUM PHOSPHATE 10 MG/ML IJ SOLN
INTRAMUSCULAR | Status: DC | PRN
  Administered 2015-11-09: 10 mg via INTRAVENOUS

## 2015-11-09 MED ORDER — ARTIFICIAL TEARS OP OINT
TOPICAL_OINTMENT | OPHTHALMIC | Status: AC
  Filled 2015-11-09: qty 3.5

## 2015-11-09 MED ORDER — FENTANYL CITRATE (PF) 250 MCG/5ML IJ SOLN
INTRAMUSCULAR | Status: AC
  Filled 2015-11-09: qty 5

## 2015-11-09 MED ORDER — ROCURONIUM BROMIDE 100 MG/10ML IV SOLN
INTRAVENOUS | Status: DC | PRN
  Administered 2015-11-09: 10 mg via INTRAVENOUS
  Administered 2015-11-09: 5 mg via INTRAVENOUS
  Administered 2015-11-09: 45 mg via INTRAVENOUS
  Administered 2015-11-09: 5 mg via INTRAVENOUS
  Administered 2015-11-09: 10 mg via INTRAVENOUS

## 2015-11-09 MED ORDER — KETOROLAC TROMETHAMINE 15 MG/ML IJ SOLN
15.0000 mg | Freq: Four times a day (QID) | INTRAMUSCULAR | Status: DC
  Administered 2015-11-09 – 2015-11-10 (×5): 15 mg via INTRAVENOUS
  Filled 2015-11-09 (×7): qty 1

## 2015-11-09 MED ORDER — SUGAMMADEX SODIUM 200 MG/2ML IV SOLN
INTRAVENOUS | Status: DC | PRN
  Administered 2015-11-09: 200 mg via INTRAVENOUS

## 2015-11-09 MED ORDER — LACTATED RINGERS IV SOLN
INTRAVENOUS | Status: DC | PRN
  Administered 2015-11-09 (×3): via INTRAVENOUS

## 2015-11-09 MED ORDER — FENTANYL CITRATE (PF) 100 MCG/2ML IJ SOLN
INTRAMUSCULAR | Status: DC | PRN
  Administered 2015-11-09: 50 ug via INTRAVENOUS
  Administered 2015-11-09: 100 ug via INTRAVENOUS
  Administered 2015-11-09 (×2): 50 ug via INTRAVENOUS

## 2015-11-09 MED ORDER — LIDOCAINE HCL (CARDIAC) 20 MG/ML IV SOLN
INTRAVENOUS | Status: DC | PRN
  Administered 2015-11-09: 100 mg via INTRAVENOUS

## 2015-11-09 MED ORDER — KCL IN DEXTROSE-NACL 20-5-0.45 MEQ/L-%-% IV SOLN
INTRAVENOUS | Status: DC
  Administered 2015-11-09: 150 mL/h via INTRAVENOUS
  Administered 2015-11-09 – 2015-11-10 (×2): via INTRAVENOUS
  Filled 2015-11-09 (×6): qty 1000

## 2015-11-09 MED ORDER — SUGAMMADEX SODIUM 200 MG/2ML IV SOLN
INTRAVENOUS | Status: AC
Start: 2015-11-09 — End: 2015-11-09
  Filled 2015-11-09: qty 2

## 2015-11-09 MED ORDER — SULFAMETHOXAZOLE-TRIMETHOPRIM 800-160 MG PO TABS: 1.0000 | ORAL_TABLET | Freq: Two times a day (BID) | ORAL | Status: DC

## 2015-11-09 MED ORDER — PROPOFOL 10 MG/ML IV BOLUS
INTRAVENOUS | Status: DC | PRN
  Administered 2015-11-09: 200 mg via INTRAVENOUS

## 2015-11-09 MED ORDER — DEXAMETHASONE SODIUM PHOSPHATE 10 MG/ML IJ SOLN
INTRAMUSCULAR | Status: AC
  Filled 2015-11-09: qty 1

## 2015-11-09 MED ORDER — HYDROCODONE-ACETAMINOPHEN 5-325 MG PO TABS: 1.0000 | ORAL_TABLET | Freq: Four times a day (QID) | ORAL | Status: DC | PRN

## 2015-11-09 MED ORDER — SODIUM CHLORIDE 0.9 % IV BOLUS (SEPSIS)
1000.0000 mL | Freq: Once | INTRAVENOUS | Status: AC
  Administered 2015-11-09: 1000 mL via INTRAVENOUS

## 2015-11-09 MED ORDER — DIPHENHYDRAMINE HCL 50 MG/ML IJ SOLN: 12.5000 mg | Freq: Four times a day (QID) | INTRAMUSCULAR | Status: DC | PRN

## 2015-11-09 MED ORDER — EPHEDRINE SULFATE 50 MG/ML IJ SOLN
INTRAMUSCULAR | Status: DC | PRN
  Administered 2015-11-09: 10 mg via INTRAVENOUS

## 2015-11-09 MED ORDER — DOCUSATE SODIUM 100 MG PO CAPS
100.0000 mg | ORAL_CAPSULE | Freq: Two times a day (BID) | ORAL | Status: DC
  Administered 2015-11-09 – 2015-11-10 (×2): 100 mg via ORAL
  Filled 2015-11-09 (×3): qty 1

## 2015-11-09 MED ORDER — GABAPENTIN 300 MG PO CAPS
300.0000 mg | ORAL_CAPSULE | Freq: Three times a day (TID) | ORAL | Status: DC
  Administered 2015-11-09 – 2015-11-10 (×3): 300 mg via ORAL
  Filled 2015-11-09 (×4): qty 1

## 2015-11-09 MED ORDER — MIDAZOLAM HCL 2 MG/2ML IJ SOLN
INTRAMUSCULAR | Status: AC
  Filled 2015-11-09: qty 2

## 2015-11-09 MED ORDER — LIDOCAINE HCL (CARDIAC) 20 MG/ML IV SOLN
INTRAVENOUS | Status: AC
  Filled 2015-11-09: qty 5

## 2015-11-09 MED ORDER — PROPOFOL 10 MG/ML IV BOLUS
INTRAVENOUS | Status: AC
  Filled 2015-11-09: qty 20

## 2015-11-09 MED ORDER — CEFAZOLIN SODIUM 1-5 GM-% IV SOLN
1.0000 g | Freq: Three times a day (TID) | INTRAVENOUS | Status: AC
  Administered 2015-11-09 – 2015-11-10 (×2): 1 g via INTRAVENOUS
  Filled 2015-11-09 (×2): qty 50

## 2015-11-09 MED ORDER — ONDANSETRON HCL 4 MG/2ML IJ SOLN
INTRAMUSCULAR | Status: DC | PRN
  Administered 2015-11-09: 4 mg via INTRAVENOUS

## 2015-11-09 MED ORDER — BUPIVACAINE-EPINEPHRINE 0.25% -1:200000 IJ SOLN
INTRAMUSCULAR | Status: DC | PRN
  Administered 2015-11-09: 30 mL

## 2015-11-09 MED ORDER — MIDAZOLAM HCL 5 MG/5ML IJ SOLN
INTRAMUSCULAR | Status: DC | PRN
  Administered 2015-11-09: 2 mg via INTRAVENOUS

## 2015-11-09 MED ORDER — PROMETHAZINE HCL 25 MG/ML IJ SOLN: 6.2500 mg | INTRAMUSCULAR | Status: DC | PRN

## 2015-11-09 MED ORDER — ACETAMINOPHEN 325 MG PO TABS: 650.0000 mg | ORAL_TABLET | ORAL | Status: DC | PRN

## 2015-11-09 MED ORDER — CEFAZOLIN SODIUM-DEXTROSE 2-3 GM-% IV SOLR
2.0000 g | INTRAVENOUS | Status: AC
  Administered 2015-11-09: 2 g via INTRAVENOUS

## 2015-11-09 MED ORDER — ROCURONIUM BROMIDE 100 MG/10ML IV SOLN
INTRAVENOUS | Status: AC
  Filled 2015-11-09: qty 1

## 2015-11-09 SURGICAL SUPPLY — 51 items
CABLE HIGH FREQUENCY MONO STRZ (ELECTRODE) ×4 IMPLANT
CATH FOLEY 2WAY SLVR 18FR 30CC (CATHETERS) ×4 IMPLANT
CATH ROBINSON RED A/P 16FR (CATHETERS) ×4 IMPLANT
CATH ROBINSON RED A/P 8FR (CATHETERS) ×4 IMPLANT
CATH TIEMANN FOLEY 18FR 5CC (CATHETERS) ×4 IMPLANT
CHLORAPREP W/TINT 26ML (MISCELLANEOUS) ×4 IMPLANT
CLIP LIGATING HEM O LOK PURPLE (MISCELLANEOUS) ×8 IMPLANT
CLOTH BEACON ORANGE TIMEOUT ST (SAFETY) ×4 IMPLANT
COVER SURGICAL LIGHT HANDLE (MISCELLANEOUS) ×4 IMPLANT
COVER TIP SHEARS 8 DVNC (MISCELLANEOUS) ×2 IMPLANT
COVER TIP SHEARS 8MM DA VINCI (MISCELLANEOUS) ×2
CUTTER ECHEON FLEX ENDO 45 340 (ENDOMECHANICALS) ×4 IMPLANT
DECANTER SPIKE VIAL GLASS SM (MISCELLANEOUS) IMPLANT
DRAPE SURG IRRIG POUCH 19X23 (DRAPES) ×4 IMPLANT
DRSG TEGADERM 4X4.75 (GAUZE/BANDAGES/DRESSINGS) ×4 IMPLANT
DRSG TEGADERM 6X8 (GAUZE/BANDAGES/DRESSINGS) ×4 IMPLANT
DRSG TEGADERM 8X12 (GAUZE/BANDAGES/DRESSINGS) ×4 IMPLANT
ELECT REM PT RETURN 9FT ADLT (ELECTROSURGICAL) ×4
ELECTRODE REM PT RTRN 9FT ADLT (ELECTROSURGICAL) ×2 IMPLANT
GAUZE SPONGE 2X2 8PLY STRL LF (GAUZE/BANDAGES/DRESSINGS) ×2 IMPLANT
GLOVE BIO SURGEON STRL SZ 6.5 (GLOVE) ×3 IMPLANT
GLOVE BIO SURGEONS STRL SZ 6.5 (GLOVE) ×1
GLOVE BIOGEL M STRL SZ7.5 (GLOVE) ×8 IMPLANT
GOWN STRL REUS W/TWL LRG LVL3 (GOWN DISPOSABLE) ×12 IMPLANT
HOLDER FOLEY CATH W/STRAP (MISCELLANEOUS) ×4 IMPLANT
IV LACTATED RINGERS 1000ML (IV SOLUTION) ×4 IMPLANT
LIQUID BAND (GAUZE/BANDAGES/DRESSINGS) ×4 IMPLANT
MANIFOLD NEPTUNE II (INSTRUMENTS) ×4 IMPLANT
NDL SAFETY ECLIPSE 18X1.5 (NEEDLE) ×2 IMPLANT
NEEDLE HYPO 18GX1.5 SHARP (NEEDLE) ×2
PACK ROBOT UROLOGY CUSTOM (CUSTOM PROCEDURE TRAY) ×4 IMPLANT
RELOAD GREEN ECHELON 45 (STAPLE) ×4 IMPLANT
SET TUBE IRRIG SUCTION NO TIP (IRRIGATION / IRRIGATOR) ×4 IMPLANT
SHEET LAVH (DRAPES) IMPLANT
SOLUTION ELECTROLUBE (MISCELLANEOUS) ×4 IMPLANT
SPONGE GAUZE 2X2 STER 10/PKG (GAUZE/BANDAGES/DRESSINGS) ×2
SUT ETHILON 3 0 PS 1 (SUTURE) ×4 IMPLANT
SUT MNCRL 3 0 RB1 (SUTURE) ×2 IMPLANT
SUT MNCRL 3 0 VIOLET RB1 (SUTURE) ×2 IMPLANT
SUT MNCRL AB 4-0 PS2 18 (SUTURE) ×8 IMPLANT
SUT MONOCRYL 3 0 RB1 (SUTURE) ×4
SUT VIC AB 0 CT1 27 (SUTURE) ×2
SUT VIC AB 0 CT1 27XBRD ANTBC (SUTURE) ×2 IMPLANT
SUT VIC AB 0 UR5 27 (SUTURE) ×4 IMPLANT
SUT VIC AB 2-0 SH 27 (SUTURE) ×2
SUT VIC AB 2-0 SH 27X BRD (SUTURE) ×2 IMPLANT
SUT VICRYL 0 UR6 27IN ABS (SUTURE) ×8 IMPLANT
SYR 27GX1/2 1ML LL SAFETY (SYRINGE) ×4 IMPLANT
TOWEL OR 17X26 10 PK STRL BLUE (TOWEL DISPOSABLE) ×4 IMPLANT
TOWEL OR NON WOVEN STRL DISP B (DISPOSABLE) ×4 IMPLANT
WATER STERILE IRR 1500ML POUR (IV SOLUTION) ×4 IMPLANT

## 2015-11-09 NOTE — Anesthesia Preprocedure Evaluation (Signed)
Anesthesia Evaluation  Patient identified by MRN, date of birth, ID band Patient awake  General Assessment Comment:History noted. CE  Reviewed: Allergy & Precautions, NPO status , Patient's Chart, lab work & pertinent test results  Airway Mallampati: II  TM Distance: >3 FB Neck ROM: Full    Dental   Pulmonary neg pulmonary ROS,    breath sounds clear to auscultation       Cardiovascular negative cardio ROS  + dysrhythmias  Rhythm:Regular Rate:Normal  History noted. CE   Neuro/Psych    GI/Hepatic negative GI ROS, Neg liver ROS,   Endo/Other  negative endocrine ROS  Renal/GU negative Renal ROS     Musculoskeletal   Abdominal   Peds  Hematology   Anesthesia Other Findings   Reproductive/Obstetrics                             Anesthesia Physical Anesthesia Plan  ASA: II  Anesthesia Plan: General   Post-op Pain Management:    Induction: Intravenous  Airway Management Planned: Oral ETT  Additional Equipment:   Intra-op Plan:   Post-operative Plan: Extubation in OR  Informed Consent: I have reviewed the patients History and Physical, chart, labs and discussed the procedure including the risks, benefits and alternatives for the proposed anesthesia with the patient or authorized representative who has indicated his/her understanding and acceptance.   Dental advisory given  Plan Discussed with: CRNA and Anesthesiologist  Anesthesia Plan Comments:         Anesthesia Quick Evaluation

## 2015-11-09 NOTE — Anesthesia Procedure Notes (Signed)
Procedure Name: Intubation Date/Time: 11/09/2015 7:35 AM Performed by: Noralyn Pick D Pre-anesthesia Checklist: Patient identified, Emergency Drugs available, Suction available and Patient being monitored Patient Re-evaluated:Patient Re-evaluated prior to inductionOxygen Delivery Method: Circle System Utilized Preoxygenation: Pre-oxygenation with 100% oxygen Intubation Type: IV induction Ventilation: Mask ventilation without difficulty Laryngoscope Size: Mac and 4 Grade View: Grade II Tube type: Oral Tube size: 7.5 mm Number of attempts: 1 Airway Equipment and Method: Stylet and Oral airway Placement Confirmation: ETT inserted through vocal cords under direct vision,  positive ETCO2 and breath sounds checked- equal and bilateral Secured at: 22 cm Tube secured with: Tape Dental Injury: Teeth and Oropharynx as per pre-operative assessment

## 2015-11-09 NOTE — Progress Notes (Signed)
Utilization review completed.  

## 2015-11-09 NOTE — Discharge Instructions (Signed)

## 2015-11-09 NOTE — Op Note (Signed)
Preoperative diagnosis: Clinically localized adenocarcinoma of the prostate (clinical stage T2a Nx Mx)  Postoperative diagnosis: Clinically localized adenocarcinoma of the prostate (clinical stage T2a Nx Mx)  Procedure:  1. Robotic assisted laparoscopic radical prostatectomy (bilateral nerve sparing) 2. Bilateral robotic assisted laparoscopic pelvic lymphadenectomy  Surgeon: Pryor Curia. M.D.  Assistant: Debbrah Alar, PA-C  Resident: Dr. Harvel Ricks  Anesthesia: General  Complications: None  EBL: 100 mL  IVF:  1800 mL crystalloid  Specimens: 1. Prostate and seminal vesicles 2. Right pelvic lymph nodes 3. Left pelvic lymph nodes  Disposition of specimens: Pathology  Drains: 1. 20 Fr coude catheter 2. # 19 Blake pelvic drain  Indication: Dale Holmes is a 64 y.o. year old patient with clinically localized prostate cancer.  After a thorough review of the management options for treatment of prostate cancer, he elected to proceed with surgical therapy and the above procedure(s).  We have discussed the potential benefits and risks of the procedure, side effects of the proposed treatment, the likelihood of the patient achieving the goals of the procedure, and any potential problems that might occur during the procedure or recuperation. Informed consent has been obtained.  Description of procedure:  The patient was taken to the operating room and a general anesthetic was administered. He was given preoperative antibiotics, placed in the dorsal lithotomy position, and prepped and draped in the usual sterile fashion. Next a preoperative timeout was performed. A urethral catheter was placed into the bladder and a site was selected near the umbilicus for placement of the camera port. This was placed using a standard open Hassan technique which allowed entry into the peritoneal cavity under direct vision and without difficulty. A 12 mm port was placed and a pneumoperitoneum  established. The camera was then used to inspect the abdomen and there was no evidence of any intra-abdominal injuries or other abnormalities. The remaining abdominal ports were then placed. 8 mm robotic ports were placed in the right lower quadrant, left lower quadrant, and far left lateral abdominal wall. A 5 mm port was placed in the right upper quadrant and a 12 mm port was placed in the right lateral abdominal wall for laparoscopic assistance. All ports were placed under direct vision without difficulty. The surgical cart was then docked.   Utilizing the cautery scissors, the bladder was reflected posteriorly allowing entry into the space of Retzius and identification of the endopelvic fascia and prostate. The periprostatic fat was then removed from the prostate allowing full exposure of the endopelvic fascia. The endopelvic fascia was then incised from the apex back to the base of the prostate bilaterally and the underlying levator muscle fibers were swept laterally off the prostate thereby isolating the dorsal venous complex. The dorsal vein was then stapled and divided with a 45 mm Flex Echelon stapler. Attention then turned to the bladder neck which was divided anteriorly thereby allowing entry into the bladder and exposure of the urethral catheter. The catheter balloon was deflated and the catheter was brought into the operative field and used to retract the prostate anteriorly. The posterior bladder neck was then examined and was divided allowing further dissection between the bladder and prostate posteriorly until the vasa deferentia and seminal vessels were identified. The vasa deferentia were isolated, divided, and lifted anteriorly. The seminal vesicles were dissected down to their tips with care to control the seminal vascular arterial blood supply. These structures were then lifted anteriorly and the space between Denonvillier's fascia and the anterior rectum  was developed with a combination of  sharp and blunt dissection. This isolated the vascular pedicles of the prostate.  The lateral prostatic fascia was then sharply incised allowing release of the neurovascular bundles bilaterally. The vascular pedicles of the prostate were then ligated with Weck clips between the prostate and neurovascular bundles and divided with sharp cold scissor dissection resulting in neurovascular bundle preservation. The neurovascular bundles were then separated off the apex of the prostate and urethra bilaterally.  The urethra was then sharply transected allowing the prostate specimen to be disarticulated. The pelvis was copiously irrigated and hemostasis was ensured. There was no evidence for rectal injury.  Attention then turned to the right pelvic sidewall. The fibrofatty tissue between the external iliac vein, confluence of the iliac vessels, hypogastric artery, and Cooper's ligament was dissected free from the pelvic sidewall with care to preserve the obturator nerve. Weck clips were used for lymphostasis and hemostasis. An identical procedure was performed on the contralateral side and the lymphatic packets were removed for permanent pathologic analysis.  Attention then turned to the urethral anastomosis. A 2-0 Vicryl slip knot was placed between Denonvillier's fascia, the posterior bladder neck, and the posterior urethra to reapproximate these structures. A double-armed 3-0 Monocryl suture was then used to perform a 360 running tension-free anastomosis between the bladder neck and urethra. A new urethral catheter was then placed into the bladder and irrigated. There were no blood clots within the bladder and the anastomosis appeared to be watertight. A #19 Blake drain was then brought through the left lateral 8 mm port site and positioned appropriately within the pelvis. It was secured to the skin with a nylon suture. The surgical cart was then undocked. The right lateral 12 mm port site was closed at the  fascial level with a 0 Vicryl suture placed laparoscopically. All remaining ports were then removed under direct vision. The prostate specimen was removed intact within the Endopouch retrieval bag via the periumbilical camera port site. This fascial opening was closed with two running 0 Vicryl sutures. 0.25% Marcaine was then injected into all port sites and all incisions were reapproximated at the skin level with 4-0 Monocryl subcuticular sutures and Dermabond. The patient appeared to tolerate the procedure well and without complications. The patient was able to be extubated and transferred to the recovery unit in satisfactory condition.   Pryor Curia MD

## 2015-11-09 NOTE — Transfer of Care (Signed)
Immediate Anesthesia Transfer of Care Note  Patient: Dale Holmes  Procedure(s) Performed: Procedure(s): ROBOTIC ASSISTED LAPAROSCOPIC RADICAL PROSTATECTOMY LEVEL 2 (N/A) PELVIC LYMPHADENECTOMY (Bilateral)  Patient Location: PACU  Anesthesia Type:General  Level of Consciousness: awake, alert  and oriented  Airway & Oxygen Therapy: Patient Spontanous Breathing and Patient connected to face mask oxygen  Post-op Assessment: Report given to RN and Post -op Vital signs reviewed and stable  Post vital signs: Reviewed and stable  Last Vitals:  Filed Vitals:   11/09/15 0524  BP: 102/68  Pulse: 73  Temp: 36.6 C  Resp: 18    Complications: No apparent anesthesia complications

## 2015-11-09 NOTE — Progress Notes (Signed)
Day of Surgery  Subjective: Patient reports no complaints. Pain is well controlled with IV tylenol. No N/V, flatus/BMs. Tolerating clears. Has not ambulated.  Objective: Vital signs in last 24 hours: Temp:  [97.5 F (36.4 C)-97.9 F (36.6 C)] 97.5 F (36.4 C) (12/15 1220) Pulse Rate:  [72-84] 78 (12/15 1220) Resp:  [9-18] 12 (12/15 1220) BP: (102-140)/(68-91) 132/76 mmHg (12/15 1220) SpO2:  [94 %-100 %] 94 % (12/15 1220) Weight:  [86.183 kg (190 lb)] 86.183 kg (190 lb) (12/15 0721)  Intake/Output from previous day:   Intake/Output this shift: Total I/O In: 3350 [I.V.:2350; IV Piggyback:1000] Out: 145 [Drains:45; Blood:100]  Physical Exam:  General:alert, cooperative and appears stated age GI: soft, appropriately tender to palpation, non-distended. Incisions C/D/I, JP with SS output  Male genitalia: foley in place with light pink urine without clots or debris Extremities: extremities normal, atraumatic, no cyanosis or edema  Lab Results:  Recent Labs  11/09/15 1149  HGB 13.8  HCT 41.0   BMET No results for input(s): NA, K, CL, CO2, GLUCOSE, BUN, CREATININE, CALCIUM in the last 72 hours. No results for input(s): LABPT, INR in the last 72 hours. No results for input(s): LABURIN in the last 72 hours. No results found for this or any previous visit.  Studies/Results: No results found.  Assessment/Plan: Day of Surgery Procedure(s) (LRB): ROBOTIC ASSISTED LAPAROSCOPIC RADICAL PROSTATECTOMY LEVEL 2 (N/A) PELVIC LYMPHADENECTOMY (Bilateral)  POD#0 RALP with BPLND. Recovering well.    Clear liquids  mIVF  Foley to straight drain  Ambulate tonight  F/u UOP  F/u JP output  Labs in AM  IS & OOB to chair  Likely d/c tomorrow PM     LOS: 0 days   Dale Holmes 11/09/2015, 4:01 PM

## 2015-11-09 NOTE — Anesthesia Postprocedure Evaluation (Signed)
Anesthesia Post Note  Patient: Dale Holmes  Procedure(s) Performed: Procedure(s) (LRB): ROBOTIC ASSISTED LAPAROSCOPIC RADICAL PROSTATECTOMY LEVEL 2 (N/A) PELVIC LYMPHADENECTOMY (Bilateral)  Patient location during evaluation: PACU Anesthesia Type: General Level of consciousness: awake Pain management: pain level controlled Vital Signs Assessment: post-procedure vital signs reviewed and stable Respiratory status: spontaneous breathing Cardiovascular status: stable Anesthetic complications: no    Last Vitals:  Filed Vitals:   11/09/15 1200 11/09/15 1220  BP: 122/73 132/76  Pulse: 72 78  Temp: 36.4 C 36.4 C  Resp:  12    Last Pain:  Filed Vitals:   11/09/15 1230  PainSc: 2                  EDWARDS,Clearnce Leja

## 2015-11-10 LAB — HEMOGLOBIN AND HEMATOCRIT, BLOOD
HEMATOCRIT: 38.3 % — AB (ref 39.0–52.0)
Hemoglobin: 13.1 g/dL (ref 13.0–17.0)

## 2015-11-10 MED ORDER — MORPHINE SULFATE (PF) 2 MG/ML IV SOLN: 1.0000 mg | INTRAVENOUS | Status: DC | PRN

## 2015-11-10 MED ORDER — HYDROCODONE-ACETAMINOPHEN 5-325 MG PO TABS: 1.0000 | ORAL_TABLET | ORAL | Status: DC | PRN

## 2015-11-10 MED ORDER — BISACODYL 10 MG RE SUPP
10.0000 mg | Freq: Once | RECTAL | Status: AC
  Administered 2015-11-10: 10 mg via RECTAL
  Filled 2015-11-10: qty 1

## 2015-11-10 NOTE — Discharge Summary (Deleted)
Physician Discharge Summary  Patient ID: Dale Holmes MRN: [id number] DOB/AGE: [birth year] 64 y.o.  Admit date: 11/09/2015 Discharge date: 11/10/2015  Admission Diagnoses: Prostate cancer  Discharge Diagnoses:  Active Problems:   Prostate cancer [facility])   Discharged Condition: good  Hospital Course:  64 yo male who is s/p robotic assisted laparoscopic prostatectomy and bilateral pelvic lymph node dissection. He did well post-operatively. His diet was slowly advanced and at the time of discharge he was tolerating a regular diet, ambulating at his baseline, having excellent UOP via foley catheter, JP drain was successfully removed, and pain was well controlled with oral narcotics. He was discharged to home on POD#1.  Consults: None  Significant Diagnostic Studies: labs: None  Treatments: surgery:  robotic assisted laparoscopic prostatectomy and bilateral pelvic lymph node dissection  Discharge Exam: Blood pressure 111/61, pulse 59, temperature 98.1 F (36.7 C), temperature source Oral, resp. rate 12, height 6\' 2"  (1.88 m), weight 86.183 kg (190 lb), SpO2 99 %. General:alert, cooperative and appears stated age GI: soft, appropriately tender to palpation, non-distended. Incisions C/D/I Male genitalia: foley in place with light pink urine without clots or debris Extremities: extremities normal, atraumatic, no cyanosis or edema  Disposition: Final discharge disposition not confirmed     Medication List    STOP taking these medications        aspirin 81 MG tablet     ibuprofen 200 MG tablet  Commonly known as:  ADVIL,MOTRIN     multivitamin tablet      TAKE these medications        gabapentin 300 MG capsule  Commonly known as:  NEURONTIN  Take 1 tablet at bedtime for 5 days, then take twice a day for the next 5 days, after that take three times a day.     HYDROcodone-acetaminophen 5-325 MG tablet  Commonly known as:  NORCO  Take 1-2 tablets by mouth every 6 (six) hours  as needed.     sulfamethoxazole-trimethoprim 800-160 MG tablet  Commonly known as:  BACTRIM DS,SEPTRA DS  Take 1 tablet by mouth 2 (two) times daily. Start the day prior to foley removal appointment           Follow-up Information    Follow up with BORDEN,LES, MD On 11/15/2015.   Specialty:  Urology   Why:  at 12:30   Contact information:   [place] [address] [phone]       Signed: Acie Fredrickson 11/10/2015, 6:32 AM

## 2015-11-10 NOTE — Progress Notes (Signed)
1 Day Post-Op  Subjective: Patient reports no complaints. Pain is well controlled without narcotics. No N/V, BMs. Passed a small amount of flatus. Tolerating clears. Ambulated last night.  Objective: Vital signs in last 24 hours: Temp:  [97.5 F (36.4 C)-98.6 F (37 C)] 98.1 F (36.7 C) (12/16 0455) Pulse Rate:  [59-84] 59 (12/16 0455) Resp:  [9-12] 12 (12/16 0455) BP: (105-140)/(58-91) 111/61 mmHg (12/16 0455) SpO2:  [94 %-100 %] 99 % (12/16 0455) Weight:  [86.183 kg (190 lb)] 86.183 kg (190 lb) (12/15 0721)  Intake/Output from previous day: 12/15 0701 - 12/16 0700 In: 4902.5 [I.V.:3842.5; IV Piggyback:1000] Out: [id number] [Urine:3100; Drains:85; Blood:100] Intake/Output this shift: Total I/O In: 1192.5 [I.V.:1192.5] Out: 3140 [Urine:3100; Drains:40]  Physical Exam:  General:alert, cooperative and appears stated age GI: soft, appropriately tender to palpation, non-distended. Incisions C/D/I, JP with SS output  Male genitalia: foley in place with clear yellow urine without clots or debris Extremities: extremities normal, atraumatic, no cyanosis or edema  Lab Results:  Recent Labs  11/09/15 1149 11/10/15 0420  HGB 13.8 13.1  HCT 41.0 38.3*   BMET No results for input(s): NA, K, CL, CO2, GLUCOSE, BUN, CREATININE, CALCIUM in the last 72 hours. No results for input(s): LABPT, INR in the last 72 hours. No results for input(s): LABURIN in the last 72 hours. No results found for this or any previous visit.  Studies/Results: No results found.  Assessment/Plan: 1 Day Post-Op Procedure(s) (LRB): ROBOTIC ASSISTED LAPAROSCOPIC RADICAL PROSTATECTOMY LEVEL 2 (N/A) PELVIC LYMPHADENECTOMY (Bilateral)  POD#1 RALP with BPLND. Recovering well.    Clear liquids  Medlock  Foley to straight drain  Bisacodyl suppository this AM  Continue ambulation, IS   F/u UOP  F/u JP output  D/C JP prior to discharge  Likely d/c today in the PM     LOS: 1 day   Acie Fredrickson 11/10/2015, 6:30 AM

## 2015-11-10 NOTE — Discharge Summary (Signed)
  Date of admission: 11/09/2015  Date of discharge: 11/10/2015  Admission diagnosis: Prostate Cancer  Discharge diagnosis: Prostate Cancer  History and Physical: For full details, please see admission history and physical. Briefly, Dale Holmes is a 64 y.o. gentleman with localized prostate cancer.  After discussing management/treatment options, he elected to proceed with surgical treatment.  Hospital Course: Dale Holmes was taken to the operating room on 11/09/2015 and underwent a robotic assisted laparoscopic radical prostatectomy. He tolerated this procedure well and without complications. Postoperatively, he was able to be transferred to a regular hospital room following recovery from anesthesia.  He was able to begin ambulating the night of surgery. He remained hemodynamically stable overnight.  He had excellent urine output with appropriately minimal output from his pelvic drain and his pelvic drain was removed on POD #1.  He was transitioned to oral pain medication, tolerated a clear liquid diet, and had met all discharge criteria and was able to be discharged home later on POD#1.  Laboratory values:  Recent Labs  11/09/15 1149 11/10/15 0420  HGB 13.8 13.1  HCT 41.0 38.3*    Disposition: Home  Discharge instruction: He was instructed to be ambulatory but to refrain from heavy lifting, strenuous activity, or driving. He was instructed on urethral catheter care.  Discharge medications:     Medication List    STOP taking these medications        aspirin 81 MG tablet     ibuprofen 200 MG tablet  Commonly known as:  ADVIL,MOTRIN     multivitamin tablet      TAKE these medications        gabapentin 300 MG capsule  Commonly known as:  NEURONTIN  Take 1 tablet at bedtime for 5 days, then take twice a day for the next 5 days, after that take three times a day.     HYDROcodone-acetaminophen 5-325 MG tablet  Commonly known as:  NORCO  Take 1-2 tablets by mouth every 6  (six) hours as needed.     sulfamethoxazole-trimethoprim 800-160 MG tablet  Commonly known as:  BACTRIM DS,SEPTRA DS  Take 1 tablet by mouth 2 (two) times daily. Start the day prior to foley removal appointment        Followup: He will followup in 1 week for catheter removal and to discuss his surgical pathology results.

## 2015-12-05 DIAGNOSIS — M62838 Other muscle spasm: Secondary | ICD-10-CM | POA: Diagnosis not present

## 2015-12-05 DIAGNOSIS — R278 Other lack of coordination: Secondary | ICD-10-CM | POA: Diagnosis not present

## 2015-12-05 DIAGNOSIS — M6281 Muscle weakness (generalized): Secondary | ICD-10-CM | POA: Diagnosis not present

## 2015-12-05 DIAGNOSIS — N3946 Mixed incontinence: Secondary | ICD-10-CM | POA: Diagnosis not present

## 2015-12-08 ENCOUNTER — Other Ambulatory Visit: Payer: Self-pay | Admitting: Sports Medicine

## 2015-12-25 DIAGNOSIS — R278 Other lack of coordination: Secondary | ICD-10-CM | POA: Diagnosis not present

## 2015-12-25 DIAGNOSIS — M62838 Other muscle spasm: Secondary | ICD-10-CM | POA: Diagnosis not present

## 2015-12-25 DIAGNOSIS — M6281 Muscle weakness (generalized): Secondary | ICD-10-CM | POA: Diagnosis not present

## 2015-12-25 DIAGNOSIS — N3946 Mixed incontinence: Secondary | ICD-10-CM | POA: Diagnosis not present

## 2016-01-02 DIAGNOSIS — R278 Other lack of coordination: Secondary | ICD-10-CM | POA: Diagnosis not present

## 2016-01-02 DIAGNOSIS — M6281 Muscle weakness (generalized): Secondary | ICD-10-CM | POA: Diagnosis not present

## 2016-01-02 DIAGNOSIS — M62838 Other muscle spasm: Secondary | ICD-10-CM | POA: Diagnosis not present

## 2016-01-02 DIAGNOSIS — N3946 Mixed incontinence: Secondary | ICD-10-CM | POA: Diagnosis not present

## 2016-01-09 DIAGNOSIS — R278 Other lack of coordination: Secondary | ICD-10-CM | POA: Diagnosis not present

## 2016-01-09 DIAGNOSIS — M62838 Other muscle spasm: Secondary | ICD-10-CM | POA: Diagnosis not present

## 2016-01-09 DIAGNOSIS — N3946 Mixed incontinence: Secondary | ICD-10-CM | POA: Diagnosis not present

## 2016-01-09 DIAGNOSIS — M6281 Muscle weakness (generalized): Secondary | ICD-10-CM | POA: Diagnosis not present

## 2016-01-19 DIAGNOSIS — C61 Malignant neoplasm of prostate: Secondary | ICD-10-CM | POA: Diagnosis not present

## 2016-01-24 DIAGNOSIS — N3946 Mixed incontinence: Secondary | ICD-10-CM | POA: Diagnosis not present

## 2016-01-24 DIAGNOSIS — M62838 Other muscle spasm: Secondary | ICD-10-CM | POA: Diagnosis not present

## 2016-01-24 DIAGNOSIS — N393 Stress incontinence (female) (male): Secondary | ICD-10-CM | POA: Diagnosis not present

## 2016-01-24 DIAGNOSIS — R278 Other lack of coordination: Secondary | ICD-10-CM | POA: Diagnosis not present

## 2016-01-24 DIAGNOSIS — M6281 Muscle weakness (generalized): Secondary | ICD-10-CM | POA: Diagnosis not present

## 2016-01-26 DIAGNOSIS — Z0000 Encounter for general adult medical examination without abnormal findings: Secondary | ICD-10-CM | POA: Diagnosis not present

## 2016-01-26 DIAGNOSIS — C61 Malignant neoplasm of prostate: Secondary | ICD-10-CM | POA: Diagnosis not present

## 2016-01-29 DIAGNOSIS — I493 Ventricular premature depolarization: Secondary | ICD-10-CM | POA: Diagnosis not present

## 2016-01-29 DIAGNOSIS — E559 Vitamin D deficiency, unspecified: Secondary | ICD-10-CM | POA: Diagnosis not present

## 2016-01-29 DIAGNOSIS — E784 Other hyperlipidemia: Secondary | ICD-10-CM | POA: Diagnosis not present

## 2016-01-29 DIAGNOSIS — E785 Hyperlipidemia, unspecified: Secondary | ICD-10-CM | POA: Diagnosis not present

## 2016-01-29 DIAGNOSIS — M4806 Spinal stenosis, lumbar region: Secondary | ICD-10-CM | POA: Diagnosis not present

## 2016-01-29 DIAGNOSIS — Z0000 Encounter for general adult medical examination without abnormal findings: Secondary | ICD-10-CM | POA: Diagnosis not present

## 2016-01-29 DIAGNOSIS — Z6824 Body mass index (BMI) 24.0-24.9, adult: Secondary | ICD-10-CM | POA: Diagnosis not present

## 2016-01-29 DIAGNOSIS — C61 Malignant neoplasm of prostate: Secondary | ICD-10-CM | POA: Diagnosis not present

## 2016-01-29 DIAGNOSIS — Z1389 Encounter for screening for other disorder: Secondary | ICD-10-CM | POA: Diagnosis not present

## 2016-02-05 ENCOUNTER — Ambulatory Visit (HOSPITAL_COMMUNITY)
Admission: RE | Admit: 2016-02-05 | Discharge: 2016-02-05 | Disposition: A | Discharge status: Home / Self Care | Payer: Medicare Other | Source: Ambulatory Visit | Attending: Surgery | Admitting: Surgery

## 2016-02-05 ENCOUNTER — Other Ambulatory Visit (HOSPITAL_COMMUNITY): Payer: Self-pay | Admitting: Internal Medicine

## 2016-02-05 DIAGNOSIS — Z0000 Encounter for general adult medical examination without abnormal findings: Secondary | ICD-10-CM | POA: Diagnosis not present

## 2016-02-05 DIAGNOSIS — Z8249 Family history of ischemic heart disease and other diseases of the circulatory system: Secondary | ICD-10-CM

## 2016-02-05 DIAGNOSIS — E785 Hyperlipidemia, unspecified: Secondary | ICD-10-CM | POA: Diagnosis not present

## 2016-02-05 DIAGNOSIS — Z136 Encounter for screening for cardiovascular disorders: Secondary | ICD-10-CM

## 2016-02-07 DIAGNOSIS — R278 Other lack of coordination: Secondary | ICD-10-CM | POA: Diagnosis not present

## 2016-02-07 DIAGNOSIS — M62838 Other muscle spasm: Secondary | ICD-10-CM | POA: Diagnosis not present

## 2016-02-07 DIAGNOSIS — M6281 Muscle weakness (generalized): Secondary | ICD-10-CM | POA: Diagnosis not present

## 2016-02-07 DIAGNOSIS — N3946 Mixed incontinence: Secondary | ICD-10-CM | POA: Diagnosis not present

## 2016-02-09 DIAGNOSIS — M546 Pain in thoracic spine: Secondary | ICD-10-CM | POA: Diagnosis not present

## 2016-02-09 DIAGNOSIS — M5126 Other intervertebral disc displacement, lumbar region: Secondary | ICD-10-CM | POA: Diagnosis not present

## 2016-02-09 DIAGNOSIS — M4726 Other spondylosis with radiculopathy, lumbar region: Secondary | ICD-10-CM | POA: Diagnosis not present

## 2016-02-09 DIAGNOSIS — M549 Dorsalgia, unspecified: Secondary | ICD-10-CM | POA: Diagnosis not present

## 2016-02-09 DIAGNOSIS — Z6825 Body mass index (BMI) 25.0-25.9, adult: Secondary | ICD-10-CM | POA: Diagnosis not present

## 2016-02-09 DIAGNOSIS — M5136 Other intervertebral disc degeneration, lumbar region: Secondary | ICD-10-CM | POA: Diagnosis not present

## 2016-03-04 ENCOUNTER — Encounter: Payer: Self-pay | Admitting: Sports Medicine

## 2016-03-06 ENCOUNTER — Ambulatory Visit (INDEPENDENT_AMBULATORY_CARE_PROVIDER_SITE_OTHER): Payer: Medicare Other | Admitting: Sports Medicine

## 2016-03-06 ENCOUNTER — Encounter: Payer: Self-pay | Admitting: Sports Medicine

## 2016-03-06 VITALS — BP 120/80 | Ht 74.0 in | Wt 190.0 lb

## 2016-03-06 DIAGNOSIS — R29898 Other symptoms and signs involving the musculoskeletal system: Secondary | ICD-10-CM

## 2016-03-06 NOTE — Progress Notes (Signed)
   Subjective:    Patient ID: Dale Holmes, male    DOB: 1951-01-10, 65 y.o.   MRN: [id number]  HPI  65 yo male with hx prostate cancer s/p prostactecomy dec 2016, multilevel DJD of L spine with back pain and right leg weakness presents for right leg weakness f/up.  Was seen by Dr. Oneida Alar on 09/2015 last. Plan at that visit was to start PT following his prostate surgery. He is currently doing pelvic floor PT after his prostate surgery.   MRI 09/2015: Small left lateral disc protrusion L1-2 and L2-3. Mild associated spinal stenosis at L2-3. Mild spinal stenosis and mild right foraminal stenosis L3-4. Small central disc protrusion L4-5 with mild spinal stenosis. Moderate left foraminal encroachment L4-5.  Continues to have back pain on low central back that's worse with activities such as playing Golf, gets better pelvic floor stretches. Has right leg weakness.   Saw Dr. Rita Ohara neurosurgery, who thought this does not warrant surgery.   Review of Systems  Constitutional: Negative for fever and chills.  Respiratory: Negative for cough, chest tightness and shortness of breath.   Cardiovascular: Negative for chest pain.  Genitourinary: Negative for dysuria and flank pain.  Musculoskeletal: Positive for back pain. Negative for arthralgias.  Neurological: Positive for weakness. Negative for dizziness, numbness and headaches.       Objective:   Physical Exam  Constitutional: He is oriented to person, place, and time. He appears well-developed and well-nourished.  HENT:  Head: Normocephalic and atraumatic.  Eyes: Conjunctivae are normal.  Musculoskeletal: Normal range of motion. He exhibits no edema or tenderness.  Right leg is ~0.5 cm longer. 5/5 strength bilaterally. Full ROM b/l.  Has a neuropathic gait, spends more time on the right leg. Right leg muscles (quad and gastroc) are less defined than right.   Neurological: He is alert and oriented to person, place, and time.     Filed  Vitals:   03/06/16 0850  BP: 120/80        Assessment & Plan:  See problem based a&p.

## 2016-03-06 NOTE — Assessment & Plan Note (Addendum)
His right leg weakness is likely 2/2 to Lumbar spinal stenosis. Al though it was radiograhically mild-moderate, clinically this is more severe as it is causing weakness, decreased muscle tone, and neuropathic gait. Compared to last visit on 09/2015, he is doing much better. Likely benefited from some leg exercises that he was doing as part of his pelvic exercises for his post prostatectomy rehab. Already had neurosurgery evaluation done - recommended against surgery at this time.  - gave him leg exercise handout. - referred to PT as he is interested in pilates and working on strength to improve his gait Will ask Tomie China, PT to assess if he is good candidate for ongoing Pilates program. - f/up PRN.

## 2016-03-11 ENCOUNTER — Other Ambulatory Visit: Payer: Self-pay | Admitting: *Deleted

## 2016-03-11 MED ORDER — GABAPENTIN 300 MG PO CAPS: 300.0000 mg | ORAL_CAPSULE | Freq: Two times a day (BID) | ORAL | Status: DC

## 2016-03-12 ENCOUNTER — Ambulatory Visit: Payer: Medicare Other | Attending: Sports Medicine | Admitting: Physical Therapy

## 2016-03-12 DIAGNOSIS — M545 Low back pain, unspecified: Secondary | ICD-10-CM

## 2016-03-12 DIAGNOSIS — M6281 Muscle weakness (generalized): Secondary | ICD-10-CM | POA: Insufficient documentation

## 2016-03-12 DIAGNOSIS — R2689 Other abnormalities of gait and mobility: Secondary | ICD-10-CM | POA: Diagnosis not present

## 2016-03-12 NOTE — Patient Instructions (Signed)
Abduction: Clam (Eccentric) - Side-Lying    Lie on side with knees bent. Lift top knee, keeping feet together. Keep trunk steady. Slowly lower for 3-5 seconds. _20__ reps per set, _2__ sets per day, _5__ days per week. Do 2 sets on Rt. And 1 set on L.  http://ecce.exer.us/64     Outer Hip Stretch: Reclined IT Band Stretch (Strap)    Strap around opposite foot, pull UP only as far as possible with shoulders on mat. And KNEE STRAIGHT Hold for _30 sec___ breaths. Repeat _2-3___ times each leg.  Copyright  VHI. All rights reserved.  Double Knee to Chest (Flexion)    Gently pull both knees toward chest. Feel stretch in lower back or buttock area. Breathing deeply, Hold _60 ___ seconds. 2-3 times, every day.  Repeat ____ times. Do ____ sessions per day.  http://gt2.exer.us/227   Copyright  VHI. All rights reserved.

## 2016-03-13 NOTE — Therapy (Signed)
[place], [place], [address] Phone: [phone]   Fax:  [phone]  Physical Therapy Evaluation  Patient Details  Name: Dale Holmes MRN: [id number] Date of Birth: 1951/10/03 Referring Provider: Dr. Stefanie Libel   Encounter Date: 03/12/2016      PT End of Session - 03/12/16 1330    Visit Number 1   Number of Visits 16   Date for PT Re-Evaluation 05/07/16   PT Start Time 1100   PT Stop Time 1145   PT Time Calculation (min) 45 min   Activity Tolerance Patient tolerated treatment well   Behavior During Therapy [facility] for tasks assessed/performed      Past Medical History  Diagnosis Date  . Hyperlipidemia 2007    NMR LDL goal = < 100  . Herniated disc, cervical     history of  . Hx of basal cell carcinoma   . Hx of cardiac arrhythmia   . Hx of hypercholesterolemia   . Hx of cardiac murmur   . Dysrhythmia     irregular heart beat   . Prostate cancer ([employer]) 08/22/15  . Skin cancer     Past Surgical History  Procedure Laterality Date  . Appendectomy    . Inguinal heriorrhaphy      R  . Colonoscopy  2004    negative; Dr Fuller Plan  . Prostate biopsy  08/22/2015  . Robot assisted laparoscopic radical prostatectomy N/A 11/09/2015    Procedure: ROBOTIC ASSISTED LAPAROSCOPIC RADICAL PROSTATECTOMY LEVEL 2;  Surgeon: Raynelle Bring, MD;  Location: [facility];  Service: Urology;  Laterality: N/A;  . Lymphadenectomy Bilateral 11/09/2015    Procedure: PELVIC LYMPHADENECTOMY;  Surgeon: Raynelle Bring, MD;  Location: [facility];  Service: Urology;  Laterality: Bilateral;    There were no vitals filed for this visit.       Subjective Assessment - 03/12/16 1109    Subjective Patient presents with limitations in mobility due to Rt. LE weakness and back pain.  He reports chronic back issues which he states began a couple yrs ago picked someone up and back gave out.  Since then he has had intermittent central back pain, gait  abnormality/limp, denies sensory changes, radiating pain.  Proximal hip weakness has been ongoing.  His symptoms limit his ability to go hiking, transitional movements.      Pertinent History recent prostate surgery, herniated cervical disc 17 yrs ago, dysrhythmia   Limitations Lifting;Standing;Walking   Diagnostic tests MRI 03/04/16 : multilevel degeneration L1-L2-L3-L4 mostly to the L.  There is a possibility chronic weakness could be from his cervical spine, but no further diagnostics have been ordered.    Patient Stated Goals Goal strengthen Rt. LE, prevent surgery   Currently in Pain? Yes  none at rest   Pain Score 2    Pain Location Back   Pain Orientation Lower   Pain Descriptors / Indicators Aching;Dull;Discomfort   Pain Type Chronic pain   Pain Radiating Towards prox hip Rt.    Pain Onset More than a month ago   Pain Frequency Intermittent   Aggravating Factors  standing, walking long periods   Pain Relieving Factors sitting, rest, stretching   Effect of Pain on Daily Activities limits physical activity   Multiple Pain Sites No            [facility] PT Assessment - 03/12/16 1113    Assessment   Medical Diagnosis Rt. LE weakness   Referring Provider Dr. Stefanie Libel  Onset Date/Surgical Date --  chronic   Prior Therapy Yes for pelvic floor   Precautions   Precautions None   Restrictions   Weight Bearing Restrictions No   Balance Screen   Has the patient fallen in the past 6 months No   [place] residence   Prior Function   Level of Independence Independent   Cognition   Overall Cognitive Status Within Functional Limits for tasks assessed   Sensation   Light Touch Appears Intact   Coordination   Gross Motor Movements are Fluid and Coordinated Not tested   Posture/Postural Control   Posture/Postural Control Postural limitations   Postural Limitations Rounded Shoulders;Forward head;Right pelvic obliquity   Posture Comments Rt.  leg appears longer in supine    AROM   AROM Assessment Site --  pain with all motions but mild, dull   Lumbar Flexion mid thigh limited by hamstring tightness   Lumbar Extension pain central, 50%    Lumbar - Right Side Bend WFL   Lumbar - Left Side Bend WFL   Lumbar - Right Rotation WFL   Lumbar - Left Rotation [facility]   Strength   Right Hip Flexion 4/5   Right Hip Extension 4+/5   Right Hip ABduction 3+/5  glute med 3/5   Left Hip Flexion 4+/5   Left Hip Extension 5/5   Left Hip ABduction 4/5  glute med 4-/5   Right Knee Flexion 5/5   Right Knee Extension 5/5   Left Knee Flexion 5/5   Left Knee Extension 5/5   Right/Left Ankle --  INV/EV WNL, decr PF on Rt. 4/5   Right Ankle Dorsiflexion 5/5   Left Ankle Dorsiflexion 5/5   Flexibility   Hamstrings 50 deg   Palpation   Spinal mobility hypomobile upper lumbar spine    Palpation comment no pain or tenderness elicited   Trendelenburg Test   Findings Positive   Side Right;Left   Comments Rt weaker than L but evident on each leg in SLS   Ambulation/Gait   Gait Pattern Step-through pattern;Right genu recurvatum;Trendelenburg;Lateral trunk lean to right             [facility] Adult PT Treatment/Exercise - 03/12/16 1113    Neuro Re-ed    Neuro Re-ed Details  Worked at the mirror to work on keeping pelvis and glute med strength    Lumbar Exercises: [employer] operational officer 2 reps;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  Corrected hamstring stretch.            PT Education - 03/12/16 1329    Education provided Yes   Education Details HEP, strength, gait, PT/POC   Person(s) Educated Patient   Methods Explanation;Demonstration;Handout   Comprehension Verbalized  understanding;Returned demonstration;Tactile cues required;Need further instruction;Verbal cues required          PT Short Term Goals - 03/12/16 1346    PT SHORT TERM GOAL #1   Title Pt will be I with HEP for initial strength/ROM   Time 4   Period Weeks   Status New   PT SHORT TERM GOAL #2   Title Pt will be able to report less  stiffness in AM and with transitions (sit to stand after sitting)   Time 4   Period Weeks   Status New   PT SHORT TERM GOAL #3   Title Pt will be able to use and understand good body mechanics and posture for mobility.    Time 4   Period Weeks   Status New           PT Long Term Goals - 19-Mar-2016 0759    PT LONG TERM GOAL #1   Title Pt will be I with HEP (more advanced) for core strength.    Time 8   Period Weeks   Status New   PT LONG TERM GOAL #2   Title Pt will be able to walk on uneven ground (beach, hike) for 1 hour with min to no pain    Time 8   Period Weeks   Status New   PT LONG TERM GOAL #3   Title Pt will increase hip abd and glute med strength to 4/5 for gait stability   Time 8   Period Weeks   Status New   PT LONG TERM GOAL #4   Title Pt will be able to walk down steps with good alignment in knee/hips and min cues.    Baseline hip adducts   Period Weeks   Status New               Plan - 03/12/16 1333    Clinical Impression Statement Patient presents for low complexity eval with significant weakness in hip ext/abd which contributes to back discomfort.  MRI identified changes on the L spinal foramina, but sx on Rt. LE.  Will work towards balancing hip strength and stability to positively impact gait, safety and ability to continue with recreational activity.     Rehab Potential Good   PT Frequency 2x / week   PT Duration 8 weeks   PT Treatment/Interventions ADLs/Self Care Home Management;Ultrasound;Neuromuscular re-education;Passive range of motion;Patient/family education;Gait training;Electrical Stimulation;Functional  mobility training;Therapeutic activities;Therapeutic exercise;Moist Heat;Manual techniques;Other (comment)  Pilates based PT   PT Next Visit Plan Check HEP, work single leg on Reformer, standing hip   PT Home Exercise Plan clam, stenosis type stretching (pt has from pelvic PT- is he still going?) and hamstring   Consulted and Agree with Plan of Care Patient      Patient will benefit from skilled therapeutic intervention in order to improve the following deficits and impairments:  Abnormal gait, Decreased range of motion, Increased fascial restricitons, Pain, Impaired flexibility, Hypomobility, Decreased mobility, Decreased strength, Postural dysfunction  Visit Diagnosis: Other abnormalities of gait and mobility  Midline low back pain without sciatica  Muscle weakness (generalized)      G-Codes - 2016/03/19 0806    Functional Assessment Tool Used clinical judgement    Functional Limitation Mobility: Walking and moving around   Mobility: Walking and Moving Around Current Status [phone]) At least 20 percent but less than 40 percent impaired, limited or restricted   Mobility: Walking and Moving Around Goal Status [phone]) At least 1 percent but less than 20 percent impaired, limited or restricted       Problem List Patient Active Problem List   Diagnosis Date Noted  . Prostate cancer ([place]) 11/09/2015  . Spinal stenosis of lumbar region 10/11/2015  . Weakness of right lower extremity 09/13/2015  . Malignant neoplasm of prostate ([place]) 09/06/2015  . 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 Lumbar radiculopathy, chronic 06/22/2015  . Abnormal prostate  by palpation 02/24/2015  . Nonspecific ST-T changes 02/16/2013  . Family history of prostate cancer 02/16/2013  . Hyperlipidemia 09/30/2011  . Basal cell cancer 09/30/2011    PAA,JENNIFER 03/13/2016, 8:08 AM  [place], [place], [address] Phone:  [phone]   Fax:  [phone]  Name: Dale Holmes MRN: [id number] Date of Birth: 1951-06-17   Raeford Razor, PT 03/13/2016 8:10 AM Phone: [phone] Fax: [phone]

## 2016-03-19 ENCOUNTER — Ambulatory Visit: Payer: Medicare Other | Admitting: Physical Therapy

## 2016-03-19 DIAGNOSIS — M545 Low back pain, unspecified: Secondary | ICD-10-CM

## 2016-03-19 DIAGNOSIS — M6281 Muscle weakness (generalized): Secondary | ICD-10-CM

## 2016-03-19 DIAGNOSIS — R2689 Other abnormalities of gait and mobility: Secondary | ICD-10-CM | POA: Diagnosis not present

## 2016-03-19 NOTE — Therapy (Signed)
[place], [place], [address] Phone: [phone]   Fax:  [phone]  Physical Therapy Treatment  Patient Details  Name: Dale Holmes MRN: [id number] Date of Birth: 01/01/1951 Referring Provider: Dr. Stefanie Libel   Encounter Date: 03/19/2016      PT End of Session - 03/19/16 1559    Visit Number 2   Number of Visits 16   Date for PT Re-Evaluation 05/07/16   PT Start Time 1330   PT Stop Time 1430   PT Time Calculation (min) 60 min   Activity Tolerance Patient tolerated treatment well   Behavior During Therapy [facility] for tasks assessed/performed      Past Medical History  Diagnosis Date  . Hyperlipidemia 2007    NMR LDL goal = < 100  . Herniated disc, cervical     history of  . Hx of basal cell carcinoma   . Hx of cardiac arrhythmia   . Hx of hypercholesterolemia   . Hx of cardiac murmur   . Dysrhythmia     irregular heart beat   . Prostate cancer ([place]) 08/22/15  . Skin cancer     Past Surgical History  Procedure Laterality Date  . Appendectomy    . Inguinal heriorrhaphy      R  . Colonoscopy  2004    negative; Dr Fuller Plan  . Prostate biopsy  08/22/2015  . Robot assisted laparoscopic radical prostatectomy N/A 11/09/2015    Procedure: ROBOTIC ASSISTED LAPAROSCOPIC RADICAL PROSTATECTOMY LEVEL 2;  Surgeon: Raynelle Bring, MD;  Location: [facility];  Service: Urology;  Laterality: N/A;  . Lymphadenectomy Bilateral 11/09/2015    Procedure: PELVIC LYMPHADENECTOMY;  Surgeon: Raynelle Bring, MD;  Location: [facility];  Service: Urology;  Laterality: Bilateral;    There were no vitals filed for this visit.      Subjective Assessment - 03/19/16 1337    Subjective Patient presents with min pain in lumbar spine.  Just returned from [place] and did have some sharp pains in back which have since resolved.  Has been stretching but not doing strength ex.    Currently in Pain? Yes   Pain Score 2    Pain Location Back    Pain Orientation Lower   Pain Type Chronic pain   Pain Onset More than a month ago            [facility] Adult PT Treatment/Exercise - 03/19/16 1401    Self-Care   Lifting bracing, stabilization    Posture neutral   Other Self-Care Comments  HEP, anatomy   Lumbar Exercises: Stretches   Active Hamstring Stretch 2 reps;30 seconds   Active Hamstring Stretch Limitations HEP    Single Knee to Chest Stretch 2 reps;30 seconds   Double Knee to Chest Stretch 1 rep;60 seconds   Pelvic Tilt 5 reps   Pelvic Tilt Limitations to find neutral    Lumbar Exercises: Supine   Ab Set 5 reps   Clam 20 reps   Clam Limitations bilat and unilateral used red band   Bent Knee Raise 20 reps   Straight Leg Raise 20 reps   Lumbar Exercises: Sidelying   Clam 20 reps   Clam Limitations red band   Moist Heat Therapy   Number Minutes Moist Heat 15 Minutes   Moist Heat Location Lumbar Spine                PT Education - 03/19/16 1600    Education provided Yes  Education Details report of MRi, anatomy of spine, core control, neutral and stab concepts, Tr A activation   Person(s) Educated Patient   Methods Explanation;Demonstration;Tactile cues;Verbal cues;Handout   Comprehension Verbalized understanding;Returned demonstration;Need further instruction          PT Short Term Goals - 03/19/16 1605    PT SHORT TERM GOAL #1   Title Pt will be I with HEP for initial strength/ROM   Status On-going   PT SHORT TERM GOAL #2   Title Pt will be able to report less stiffness in AM and with transitions (sit to stand after sitting)   Status On-going   PT SHORT TERM GOAL #3   Title Pt will be able to use and understand good body mechanics and posture for mobility.    Status On-going           PT Long Term Goals - 03/19/16 1605    PT LONG TERM GOAL #1   Title Pt will be I with HEP (more advanced) for core strength.    Status On-going   PT LONG TERM GOAL #2   Title Pt will be able to walk on  uneven ground (beach, hike) for 1 hour with min to no pain    Status On-going   PT LONG TERM GOAL #3   Title Pt will increase hip abd and glute med strength to 4/5 for gait stability   Status On-going   PT LONG TERM GOAL #4   Title Pt will be able to walk down steps with good alignment in knee/hips and min cues.    Status On-going               Plan - 03/19/16 1600    Clinical Impression Statement Patient without an increase in pain but apparent lower abdominal weakness as evidenced by inability to maintain neutral spine and pelvis during ab progression.  Able to work on core, glute med strength and complete stretching program without increased pain. He has many questionsabout his prognosis.  He also has many exercises that have been prescribed from previous PT and MD and would like to streamline those next visit.    PT Next Visit Plan Check HEP, work single leg on Reformer, standing hip   PT Home Exercise Plan clam, stenosis type stretching and hamstring added PrePilates today    Consulted and Agree with Plan of Care Patient      Patient will benefit from skilled therapeutic intervention in order to improve the following deficits and impairments:  Abnormal gait, Decreased range of motion, Increased fascial restricitons, Pain, Impaired flexibility, Hypomobility, Decreased mobility, Decreased strength, Postural dysfunction  Visit Diagnosis: Other abnormalities of gait and mobility  Midline low back pain without sciatica  Muscle weakness (generalized)     Problem List Patient Active Problem List   Diagnosis Date Noted  . Prostate cancer ([place]) 11/09/2015  . Spinal stenosis of lumbar region 10/11/2015  . Weakness of right lower extremity 09/13/2015  . Malignant neoplasm of prostate ([place]) 09/06/2015  . 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 Lumbar radiculopathy, chronic 06/22/2015  . Abnormal prostate by palpation 02/24/2015  . Nonspecific ST-T changes 02/16/2013   . Family history of prostate cancer 02/16/2013  . Hyperlipidemia 09/30/2011  . Basal cell cancer 09/30/2011    Maddux Vanscyoc 03/19/2016, 4:06 PM  [facility] [address] [place], [place], [address] Phone: [phone]   Fax:  [phone]  Name: INDALECIO VAJDA MRN: [id number] Date of  Birth: [birth year]  Raeford Razor, PT 03/19/2016 4:06 PM Phone: [phone] Fax: [phone]

## 2016-03-19 NOTE — Patient Instructions (Signed)
   PELVIC TILT  Lie on back, legs bent. Exhale, tilting top of pelvis back, pubic bone up, to flatten lower back. Inhale, rolling pelvis opposite way, top forward, pubic bone down, arch in back. Repeat __10__ times. Do __2__ sessions per day. Copyright  VHI. All rights reserved.    Isometric Hold With Pelvic Floor (Hook-Lying)  Lie with hips and knees bent. Slowly inhale, and then exhale. Pull navel toward spine and tighten pelvic floor. Hold for __10_ seconds. Continue to breathe in and out during hold. Rest for _10__ seconds. Repeat __10_ times. Do __2-3_ times a day.   Knee Fold  Lie on back, legs bent, arms by sides. Exhale, lifting knee to chest. Inhale, returning. Keep abdominals flat, navel to spine. Repeat __10__ times, alternating legs. Do __2__ sessions per day.  Knee Drop  Keep pelvis stable. Without rotating hips, slowly drop knee to side, pause, return to center, bring knee across midline toward opposite hip. Feel obliques engaging. Repeat for ___10_ times each leg.   Copyright  VHI. All rights reserved.         

## 2016-03-26 ENCOUNTER — Ambulatory Visit: Payer: Medicare Other | Attending: Sports Medicine | Admitting: Physical Therapy

## 2016-03-26 DIAGNOSIS — M545 Low back pain, unspecified: Secondary | ICD-10-CM

## 2016-03-26 DIAGNOSIS — M6281 Muscle weakness (generalized): Secondary | ICD-10-CM | POA: Diagnosis not present

## 2016-03-26 DIAGNOSIS — R2689 Other abnormalities of gait and mobility: Secondary | ICD-10-CM | POA: Diagnosis not present

## 2016-03-26 NOTE — Therapy (Signed)
[place], [place], [address] Phone: [phone]   Fax:  [phone]  Physical Therapy Treatment  Patient Details  Name: Dale Holmes MRN: [id number] Date of Birth: Oct 31, 1951 Referring Provider: Dr. Stefanie Libel   Encounter Date: 03/26/2016      PT End of Session - 03/26/16 1034    Visit Number 3   Number of Visits 16   Date for PT Re-Evaluation 05/07/16   PT Start Time 6168   PT Stop Time 1108   PT Time Calculation (min) 54 min   Activity Tolerance Patient tolerated treatment well   Behavior During Therapy [facility] for tasks assessed/performed      Past Medical History  Diagnosis Date  . Hyperlipidemia 2007    NMR LDL goal = < 100  . Herniated disc, cervical     history of  . Hx of basal cell carcinoma   . Hx of cardiac arrhythmia   . Hx of hypercholesterolemia   . Hx of cardiac murmur   . Dysrhythmia     irregular heart beat   . Prostate cancer ([place]) 08/22/15  . Skin cancer     Past Surgical History  Procedure Laterality Date  . Appendectomy    . Inguinal heriorrhaphy      R  . Colonoscopy  2004    negative; Dr Fuller Plan  . Prostate biopsy  08/22/2015  . Robot assisted laparoscopic radical prostatectomy N/A 11/09/2015    Procedure: ROBOTIC ASSISTED LAPAROSCOPIC RADICAL PROSTATECTOMY LEVEL 2;  Surgeon: Raynelle Bring, MD;  [facility];  Service: Urology;  Laterality: N/A;  . Lymphadenectomy Bilateral 11/09/2015    Procedure: PELVIC LYMPHADENECTOMY;  Surgeon: Raynelle Bring, MD;  [facility];  Service: Urology;  Laterality: Bilateral;    There were no vitals filed for this visit.      Subjective Assessment - 03/26/16 1031    Subjective No back pain today.  Brought in exercises given to him by other health care providers.  Many questions on technique.    Currently in Pain? No/denies            [facility] PT Assessment - 03/26/16 1052    Flexibility   Hamstrings Lt. 65, Rt. 53 deg             [facility] Adult PT Treatment/Exercise - 03/26/16 1039    Lumbar Exercises: Stretches   Active Hamstring Stretch 2 reps;30 seconds   Active Hamstring Stretch Limitations HEP    Single Knee to Chest Stretch 2 reps;30 seconds   Double Knee to Chest Stretch 1 rep;60 seconds   Lumbar Exercises: Supine   Ab Set 10 reps   Clam 20 reps   Clam Limitations on foam roller   Bent Knee Raise 10 reps   Other Supine Lumbar Exercises pelvic tilt on foam x 10    Other Supine Lumbar Exercises Pilates Tower: [place]  Supine leg single and double arcs, squats worked on Rt. knee control , sidelying leg work adduction and sidekicks, manual A needed 50% of the time for hip control.         Self care: Trigger point release, principles of ex, "old HEP" Use of tennis ball on wall, foam roller for MFR. Pilates history.             PT Short Term Goals - 03/26/16 1034    PT SHORT TERM GOAL #1   Title Pt will be I with HEP for initial strength/ROM  Status On-going   PT SHORT TERM GOAL #2   Title Pt will be able to report less stiffness in AM and with transitions (sit to stand after sitting)   Status On-going   PT SHORT TERM GOAL #3   Title Pt will be able to use and understand good body mechanics and posture for mobility.    Status On-going           PT Long Term Goals - 03/26/16 1034    PT LONG TERM GOAL #1   Title Pt will be I with HEP (more advanced) for core strength.    Status On-going   PT LONG TERM GOAL #2   Title Pt will be able to walk on uneven ground (beach, hike) for 1 hour with min to no pain    Status On-going   PT LONG TERM GOAL #3   Title Pt will increase hip abd and glute med strength to 4/5 for gait stability   Status On-going   PT LONG TERM GOAL #4   Title Pt will be able to walk down steps with good alignment in knee/hips and min cues.    Status On-going               Plan - 03/26/16 1104    Clinical Impression Statement Pain free today,  able toi establish a prioritization and understanding of HEP.  Educated on intention of the exercise, principles of breathing to maintain core contractions, controlled motions.  He needs increased time for reinforcement.  PIlates Tower effective in giving feedback on stability. NO goals met further.   PT Next Visit Plan Check HEP, work single leg on Reformer, standing hip   PT Home Exercise Plan clam, stenosis type stretching and hamstring added PrePilates today . Asked that he do lower ab progression (prepilates) and hip /glute med as a priority.  Step ups require too much cueing.  Needs constant feedback and cues.    Consulted and Agree with Plan of Care Patient      Patient will benefit from skilled therapeutic intervention in order to improve the following deficits and impairments:  Abnormal gait, Decreased range of motion, Increased fascial restricitons, Pain, Impaired flexibility, Hypomobility, Decreased mobility, Decreased strength, Postural dysfunction  Visit Diagnosis: Other abnormalities of gait and mobility  Midline low back pain without sciatica  Muscle weakness (generalized)     Problem List Patient Active Problem List   Diagnosis Date Noted  . Prostate cancer ([place]) 11/09/2015  . Spinal stenosis of lumbar region 10/11/2015  . Weakness of right lower extremity 09/13/2015  . Malignant neoplasm of prostate ([place]) 09/06/2015  . 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 Lumbar radiculopathy, chronic 06/22/2015  . Abnormal prostate by palpation 02/24/2015  . Nonspecific ST-T changes 02/16/2013  . Family history of prostate cancer 02/16/2013  . Hyperlipidemia 09/30/2011  . Basal cell cancer 09/30/2011    Cerina Leary 03/26/2016, 11:17 AM  [facility] [address] [place], [place], [address] Phone: [phone]   Fax:  [phone]  Name: Dale Holmes MRN: [id number] Date of Birth: [birth year]    Raeford Razor,  PT 03/26/2016 11:17 AM Phone: [phone] Fax: [phone]

## 2016-03-28 ENCOUNTER — Encounter: Payer: Medicare Other | Admitting: Physical Therapy

## 2016-04-02 ENCOUNTER — Ambulatory Visit: Payer: Medicare Other | Admitting: Physical Therapy

## 2016-04-02 ENCOUNTER — Encounter: Payer: Medicare Other | Admitting: Physical Therapy

## 2016-04-02 DIAGNOSIS — M545 Low back pain, unspecified: Secondary | ICD-10-CM

## 2016-04-02 DIAGNOSIS — M6281 Muscle weakness (generalized): Secondary | ICD-10-CM | POA: Diagnosis not present

## 2016-04-02 DIAGNOSIS — R2689 Other abnormalities of gait and mobility: Secondary | ICD-10-CM | POA: Diagnosis not present

## 2016-04-02 NOTE — Therapy (Signed)
[place], [place], [address] Phone: [phone]   Fax:  [phone]  Physical Therapy Treatment  Patient Details  Name: Dale Holmes MRN: [id number] Date of Birth: [birth year] Referring Provider: Dr. Stefanie Libel   Encounter Date: 04/02/2016      PT End of Session - 04/02/16 1028    Visit Number 4   Number of Visits 16   Date for PT Re-Evaluation 05/07/16   PT Start Time 1017   PT Stop Time 1102   PT Time Calculation (min) 45 min   Activity Tolerance Patient tolerated treatment well   Behavior During Therapy [facility] for tasks assessed/performed      Past Medical History  Diagnosis Date  . Hyperlipidemia 2007    NMR LDL goal = < 100  . Herniated disc, cervical     history of  . Hx of basal cell carcinoma   . Hx of cardiac arrhythmia   . Hx of hypercholesterolemia   . Hx of cardiac murmur   . Dysrhythmia     irregular heart beat   . Prostate cancer ([place]) 08/22/15  . Skin cancer     Past Surgical History  Procedure Laterality Date  . Appendectomy    . Inguinal heriorrhaphy      R  . Colonoscopy  2004    negative; Dr Fuller Plan  . Prostate biopsy  08/22/2015  . Robot assisted laparoscopic radical prostatectomy N/A 11/09/2015    Procedure: ROBOTIC ASSISTED LAPAROSCOPIC RADICAL PROSTATECTOMY LEVEL 2;  Surgeon: Raynelle Bring, MD;  [facility];  Service: Urology;  Laterality: N/A;  . Lymphadenectomy Bilateral 11/09/2015    Procedure: PELVIC LYMPHADENECTOMY;  Surgeon: Raynelle Bring, MD;  Location: [facility];  Service: Urology;  Laterality: Bilateral;    There were no vitals filed for this visit.      Subjective Assessment - 04/02/16 1017    Subjective No symptoms just a burning fairly constantly.  ("Not pain")   Currently in Pain? No/denies           [facility] Adult PT Treatment/Exercise - 04/02/16 1100    Lumbar Exercises: Supine   Other Supine Lumbar Exercises bridge with ball x 10 2 Red 1 blue,  Arm Arcs 1 Red 1 Yellow x 10 and T press x 10    Other Supine Lumbar Exercises Pilates Reformer Footwork 2 Red 1 Green parallel, heels, ball of feet and turnout, heel raises, single leg work esp on Rt. LE 1 Red 1 Green    Lumbar Exercises: Sidelying   Other Sidelying Lumbar Exercises Pilates Reformer sidelying footwork (single leg 2 red)   max cues     Pt needs max cues for alignment, and purpose of exercise.  Rt. Knee hyperextends with footwork, pelvis drops indicating glute med weakness.  With Arm work, pt had difficulty keeping legs in a table top position, felt a strain in low back but did not report.  Needed manual A to maintain hips flexed 90 deg and engage lower abd.          PT Education - 04/02/16 1109    Education provided Yes   Education Details PIlates and alignment during exercises on Reformer, Pt with questions regarding POC and length of time he "needs to come". Discussed cont with HEP and possibly Pilates pose PT.    Person(s) Educated Patient   Methods Explanation;Demonstration;Verbal cues;Tactile cues   Comprehension Verbalized understanding;Need further instruction;Verbal cues required;Tactile cues required  PT Short Term Goals - 03/26/16 1034    PT SHORT TERM GOAL #1   Title Pt will be I with HEP for initial strength/ROM   Status On-going   PT SHORT TERM GOAL #2   Title Pt will be able to report less stiffness in AM and with transitions (sit to stand after sitting)   Status On-going   PT SHORT TERM GOAL #3   Title Pt will be able to use and understand good body mechanics and posture for mobility.    Status On-going           PT Long Term Goals - 03/26/16 1034    PT LONG TERM GOAL #1   Title Pt will be I with HEP (more advanced) for core strength.    Status On-going   PT LONG TERM GOAL #2   Title Pt will be able to walk on uneven ground (beach, hike) for 1 hour with min to no pain    Status On-going   PT LONG TERM GOAL #3   Title Pt will  increase hip abd and glute med strength to 4/5 for gait stability   Status On-going   PT LONG TERM GOAL #4   Title Pt will be able to walk down steps with good alignment in knee/hips and min cues.    Status On-going               Plan - 04/02/16 1110    Clinical Impression Statement Pt able to work on Reformer effectively and notice differences in R/L. leg strength.  He has difficulty with carryover from R to L side.  Pt has learned breathing patterns previously in his pelvic floor PT that are the opposite (per patient) of how I instruct.  I urged him that breathing "wrong" should not be a source of frustration, but it can be a tool in engaging the proper muscles.  Fatigued after session but no increase in pain.    PT Next Visit Plan glute med work, standing hip ex, sidelying and core stab.  May not finish POC into June    PT Home Exercise Plan clam, stenosis type stretching and hamstring added PrePilates today . Asked that he do lower ab progression (prepilates) and hip /glute med as a priority.  Step ups require too much cueing.  Needs constant feedback and cues.    Consulted and Agree with Plan of Care Patient      Patient will benefit from skilled therapeutic intervention in order to improve the following deficits and impairments:  Abnormal gait, Decreased range of motion, Increased fascial restricitons, Pain, Impaired flexibility, Hypomobility, Decreased mobility, Decreased strength, Postural dysfunction  Visit Diagnosis: Other abnormalities of gait and mobility  Midline low back pain without sciatica  Muscle weakness (generalized)     Problem List Patient Active Problem List   Diagnosis Date Noted  . Prostate cancer ([place]) 11/09/2015  . Spinal stenosis of lumbar region 10/11/2015  . Weakness of right lower extremity 09/13/2015  . Malignant neoplasm of prostate ([place]) 09/06/2015  . 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 Lumbar radiculopathy, chronic 06/22/2015   . Abnormal prostate by palpation 02/24/2015  . Nonspecific ST-T changes 02/16/2013  . Family history of prostate cancer 02/16/2013  . Hyperlipidemia 09/30/2011  . Basal cell cancer 09/30/2011    Jasan Doughtie 04/02/2016, 11:23 AM  [facility] [address] [place], [place], [address] Phone: [phone]   Fax:  [phone]  Name: Dale Boldon  Holmes MRN: [id number] Date of Birth: 03-07-1951    Raeford Razor, PT 04/02/2016 11:23 AM Phone: [phone] Fax: [phone]

## 2016-04-04 ENCOUNTER — Encounter: Payer: Medicare Other | Admitting: Physical Therapy

## 2016-04-04 ENCOUNTER — Ambulatory Visit: Payer: Medicare Other | Admitting: Physical Therapy

## 2016-04-04 DIAGNOSIS — M6281 Muscle weakness (generalized): Secondary | ICD-10-CM

## 2016-04-04 DIAGNOSIS — R2689 Other abnormalities of gait and mobility: Secondary | ICD-10-CM | POA: Diagnosis not present

## 2016-04-04 DIAGNOSIS — M545 Low back pain, unspecified: Secondary | ICD-10-CM

## 2016-04-04 NOTE — Therapy (Signed)
[place], [place], [address] Phone: [phone]   Fax:  [phone]  Physical Therapy Treatment  Patient Details  Name: Dale Holmes MRN: [id number] Date of Birth: 1951-11-07 Referring Provider: Dr. Stefanie Libel   Encounter Date: 04/04/2016      PT End of Session - 04/04/16 1555    Visit Number 5   Number of Visits 16   Date for PT Re-Evaluation 05/07/16   PT Start Time [id number]   PT Stop Time 1500   PT Time Calculation (min) 45 min   Activity Tolerance Patient tolerated treatment well   Behavior During Therapy [facility] for tasks assessed/performed      Past Medical History  Diagnosis Date  . Hyperlipidemia 2007    NMR LDL goal = < 100  . Herniated disc, cervical     history of  . Hx of basal cell carcinoma   . Hx of cardiac arrhythmia   . Hx of hypercholesterolemia   . Hx of cardiac murmur   . Dysrhythmia     irregular heart beat   . Prostate cancer ([place]) 08/22/15  . Skin cancer     Past Surgical History  Procedure Laterality Date  . Appendectomy    . Inguinal heriorrhaphy      R  . Colonoscopy  2004    negative; Dr Fuller Plan  . Prostate biopsy  08/22/2015  . Robot assisted laparoscopic radical prostatectomy N/A 11/09/2015    Procedure: ROBOTIC ASSISTED LAPAROSCOPIC RADICAL PROSTATECTOMY LEVEL 2;  Surgeon: Raynelle Bring, MD;  Location: [facility];  Service: Urology;  Laterality: N/A;  . Lymphadenectomy Bilateral 11/09/2015    Procedure: PELVIC LYMPHADENECTOMY;  Surgeon: Raynelle Bring, MD;  Location: [facility];  Service: Urology;  Laterality: Bilateral;    There were no vitals filed for this visit.      Subjective Assessment - 04/04/16 1551    Subjective Patient reports no pain just a weak feeling in his right hip. he feels like his balance is off and he lists to the right side.    Pertinent History recent prostate surgery, herniated cervical disc 17 yrs ago, dysrhythmia   Limitations  Lifting;Standing;Walking   Diagnostic tests MRI 03/04/16 : multilevel degeneration L1-L2-L3-L4 mostly to the L.  There is a possibility chronic weakness could be from his cervical spine, but no further diagnostics have been ordered.    Patient Stated Goals Goal strengthen Rt. LE, prevent surgery                         [facility] Adult PT Treatment/Exercise - 04/04/16 0001    Exercises   Exercises Knee/Hip   Lumbar Exercises: Stretches   Active Hamstring Stretch 2 reps;30 seconds   Lumbar Exercises: Supine   Bridge Limitations Bridge with red band around the legs   Other Supine Lumbar Exercises bridge with ball x 10 2 Red 1 blue, Arm Arcs 1 Red 1 Yellow x 10 and T press x 10    Other Supine Lumbar Exercises Pilates Reformer Footwork 2 Red 1 Green parallel, heels, ball of feet and turnout, heel raises, single leg work esp on Rt. LE 1 Red 1 Green    Lumbar Exercises: Sidelying   Other Sidelying Lumbar Exercises Pilates Reformer sidelying footwork (single leg 2 red)   max cues    Knee/Hip Exercises: Standing   Abduction Limitations 2x10 Y    Extension Limitations 2x10 Y    SLS 3x15 sec  holds at the mirror in to watch posture   Other Standing Knee Exercises lateral band walk 2x10                 PT Education - 04/04/16 1554    Education Details Improtance of single leg stability and the job of the glut [employer]) Educated Patient   Methods Explanation;Demonstration;Tactile cues;Verbal cues   Comprehension Verbalized understanding;Returned demonstration;Tactile cues required          PT Short Term Goals - 03/26/16 1034    PT SHORT TERM GOAL #1   Title Pt will be I with HEP for initial strength/ROM   Status On-going   PT SHORT TERM GOAL #2   Title Pt will be able to report less stiffness in AM and with transitions (sit to stand after sitting)   Status On-going   PT SHORT TERM GOAL #3   Title Pt will be able to use and understand good body mechanics and  posture for mobility.    Status On-going           PT Long Term Goals - 03/26/16 1034    PT LONG TERM GOAL #1   Title Pt will be I with HEP (more advanced) for core strength.    Status On-going   PT LONG TERM GOAL #2   Title Pt will be able to walk on uneven ground (beach, hike) for 1 hour with min to no pain    Status On-going   PT LONG TERM GOAL #3   Title Pt will increase hip abd and glute med strength to 4/5 for gait stability   Status On-going   PT LONG TERM GOAL #4   Title Pt will be able to walk down steps with good alignment in knee/hips and min cues.    Status On-going               Plan - 04/04/16 1556    Clinical Impression Statement Pt tolerated treatment well. He had some single leg instability. Pt given updated HEP for lateral hip stabilization exercises. He continues to require some cuing for breathing but overall he did well.    Rehab Potential Good   PT Frequency 2x / week   PT Duration 8 weeks   PT Treatment/Interventions ADLs/Self Care Home Management;Ultrasound;Neuromuscular re-education;Passive range of motion;Patient/family education;Gait training;Electrical Stimulation;Functional mobility training;Therapeutic activities;Therapeutic exercise;Moist Heat;Manual techniques;Other (comment)   PT Next Visit Plan glute med work, standing hip ex, sidelying and core stab.  May not finish POC into June    PT Home Exercise Plan clam, stenosis type stretching and hamstring added PrePilates today . Asked that he do lower ab progression (prepilates) and hip /glute med as a priority.  Step ups require too much cueing.  Needs constant feedback and cues.    Consulted and Agree with Plan of Care Patient      Patient will benefit from skilled therapeutic intervention in order to improve the following deficits and impairments:  Abnormal gait, Decreased range of motion, Increased fascial restricitons, Pain, Impaired flexibility, Hypomobility, Decreased mobility, Decreased  strength, Postural dysfunction  Visit Diagnosis: Other abnormalities of gait and mobility  Muscle weakness (generalized)  Midline low back pain without sciatica   Neuro-re ed: to improve core stability  There-ex: to improve glut medius strength and single leg stability with gait.   Problem List Patient Active Problem List   Diagnosis Date Noted  . Prostate cancer ([place]) 11/09/2015  . Spinal stenosis of lumbar  region 10/11/2015  . Weakness of right lower extremity 09/13/2015  . Malignant neoplasm of prostate ([place]) 09/06/2015  . 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 Lumbar radiculopathy, chronic 06/22/2015  . Abnormal prostate by palpation 02/24/2015  . Nonspecific ST-T changes 02/16/2013  . Family history of prostate cancer 02/16/2013  . Hyperlipidemia 09/30/2011  . Basal cell cancer 09/30/2011    Carney Living PT DPT  04/04/2016, 3:59 PM  [facility] [address] [place], [place], [address] Phone: [phone]   Fax:  [phone]  Name: AEDYN PENZA MRN: [id number] Date of Birth: November 07, 1951

## 2016-04-09 ENCOUNTER — Encounter: Payer: Medicare Other | Admitting: Physical Therapy

## 2016-04-09 ENCOUNTER — Ambulatory Visit: Payer: Medicare Other | Admitting: Physical Therapy

## 2016-04-09 ENCOUNTER — Ambulatory Visit (INDEPENDENT_AMBULATORY_CARE_PROVIDER_SITE_OTHER): Payer: Medicare Other | Admitting: Family Medicine

## 2016-04-09 ENCOUNTER — Encounter: Payer: Self-pay | Admitting: Family Medicine

## 2016-04-09 ENCOUNTER — Ambulatory Visit
Admission: RE | Admit: 2016-04-09 | Discharge: 2016-04-09 | Disposition: A | Discharge status: Home / Self Care | Payer: Medicare Other | Source: Ambulatory Visit | Attending: Family Medicine | Admitting: Family Medicine

## 2016-04-09 VITALS — Ht 74.0 in | Wt 190.0 lb

## 2016-04-09 DIAGNOSIS — R2689 Other abnormalities of gait and mobility: Secondary | ICD-10-CM

## 2016-04-09 DIAGNOSIS — M545 Low back pain, unspecified: Secondary | ICD-10-CM

## 2016-04-09 DIAGNOSIS — M6281 Muscle weakness (generalized): Secondary | ICD-10-CM

## 2016-04-09 DIAGNOSIS — M25522 Pain in left elbow: Secondary | ICD-10-CM

## 2016-04-09 DIAGNOSIS — S52092A Other fracture of upper end of left ulna, initial encounter for closed fracture: Secondary | ICD-10-CM | POA: Diagnosis not present

## 2016-04-09 NOTE — Patient Instructions (Signed)
[facility] [address] Dorna Leitz Tuesday 04/09/16 at 330pm [address], [place], [place] [address] Phone: [phone]

## 2016-04-09 NOTE — Progress Notes (Signed)
  JAYNI ELLWANGER - 65 y.o. male MRN [id number]  Date of birth: [birth year]  CC: Left elbow fracture  SUBJECTIVE:   HPI  Mr. Armentrout is a very pleasant 65 year old gentleman known to our clinic for right lower extremity weakness secondary to spinal stenosis who presents after falling down 4 stairs yesterday evening and landing on the posterior aspect of his elbow in a flexed position. We ordered x-rays which we are reviewing here today and which show a displaced comminuted oblique fracture of the proximal ulna extending into the left elbow. He denies any numbness or tingling in the left hand. He denies any weakness in the left hand. Reports very minimal pain and was not going to come in to have this evaluated until his physical therapist recommended he should have it evaluated while at therapy today.  ROS:     No confusion. No headache. As listed above swelling and bruising around the left elbow. Mild pain around the left elbow. No numbness or tingling or weakness of the left upper extremity  HISTORY: Past Medical, Surgical, Social, and Family History Reviewed & Updated per EMR.  Pertinent Historical Findings include: Noncontributory  OBJECTIVE: Ht 6\' 2"  (1.88 m)  Wt 190 lb (86.183 kg)  BMI 24.38 kg/m2  Physical Exam  Calm, no acute distress Nonlabored breathing  Limited range of motion of the left elbow with surrounding swelling. Patient does have motion from 30-90 although range of motion was not thoroughly assessed. Minimal pain with pronation and supination. Grip strength is 5 out of 5 and he is neurovascularly intact distally to the injury.  X-ray results: Three-view of the left elbow shows a comminuted intra-articular left proximal ulna fracture best seen on the lateral.  MEDICATIONS, LABS & OTHER ORDERS: Previous Medications   CIALIS 5 MG TABLET       GABAPENTIN (NEURONTIN) 300 MG CAPSULE    Take 1 capsule (300 mg total) by mouth 2 (two) times daily.   ROSUVASTATIN (CRESTOR) 10 MG TABLET        TADALAFIL (CIALIS) 10 MG TABLET    Take 10 mg by mouth daily as needed for erectile dysfunction.   Modified Medications   No medications on file   New Prescriptions   No medications on file   Discontinued Medications   No medications on file   Orders Placed This Encounter  Procedures  . DG Elbow Complete Left   ASSESSMENT & PLAN: Left intra-articular proximal ulna comminuted, oblique fracture: After returning with the x-ray results we're sending Mr. Mcginn to see Dr. Berenice Primas at 3:30 today. We have requested he move his elbow minimally. We will see him back for his lower extremity weakness or any other issue as needed in the future. Call with any questions. Follow-up as needed.

## 2016-04-09 NOTE — Therapy (Addendum)
[place], [place], [address] Phone: [phone]   Fax:  [phone]  Physical Therapy Treatment/DIscharge  Patient Details  Name: Dale Holmes MRN: [id number] Date of Birth: 15-Jan-1951 Referring Provider: Dr. Stefanie Libel   Encounter Date: 04/09/2016      PT End of Session - 04/09/16 1011    Visit Number 6   Number of Visits 16   Date for PT Re-Evaluation 05/07/16   PT Start Time 0930   PT Stop Time 1015   PT Time Calculation (min) 45 min   Activity Tolerance Patient tolerated treatment well   Behavior During Therapy [facility] for tasks assessed/performed      Past Medical History  Diagnosis Date  . Hyperlipidemia 2007    NMR LDL goal = < 100  . Herniated disc, cervical     history of  . Hx of basal cell carcinoma   . Hx of cardiac arrhythmia   . Hx of hypercholesterolemia   . Hx of cardiac murmur   . Dysrhythmia     irregular heart beat   . Prostate cancer ([place]) 08/22/15  . Skin cancer     Past Surgical History  Procedure Laterality Date  . Appendectomy    . Inguinal heriorrhaphy      R  . Colonoscopy  2004    negative; Dr Fuller Plan  . Prostate biopsy  08/22/2015  . Robot assisted laparoscopic radical prostatectomy N/A 11/09/2015    Procedure: ROBOTIC ASSISTED LAPAROSCOPIC RADICAL PROSTATECTOMY LEVEL 2;  Surgeon: Raynelle Bring, MD;  [facility]: [facility];  Service: Urology;  Laterality: N/A;  . Lymphadenectomy Bilateral 11/09/2015    Procedure: PELVIC LYMPHADENECTOMY;  Surgeon: Raynelle Bring, MD;  Location: [facility];  Service: Urology;  Laterality: Bilateral;    There were no vitals filed for this visit.      Subjective Assessment - 04/09/16 0933    Subjective Back feels great.  [place] [address] outside my house yesterday and hit my elbow on the concrete.  I can't straighten it all the way.  Recommended pt see MD today.    Currently in Pain? No/denies                         [facility] Adult PT  Treatment/Exercise - 04/09/16 0945    Knee/Hip Exercises: Supine   Other Supine Knee/Hip Exercises Pilates Reformer : Foot work 2 Red 1 blue single leg parallel anf turn out on heel, up to 20 reps on RLE   Other Supine Knee/Hip Exercises used bilateral LE for foorwork as well 2 Red 1 Green    Knee/Hip Exercises: Sidelying   Other Sidelying Knee/Hip Exercises Single leg footwork addressing hip abd       Pilates Reformer used for LE/core strength, postural strength, lumbopelvic disassociation and core control.   Bridging 3 springs with band wrapped around thighs. Better low lumbar articulation today with cueing.  Feet in Straps 1 red for single leg hamstring stretch, opp leg groin stretch  30 sec x 2 each LE   looked at SLS at end of session and observed hip drop, tried to get him to activate in standing, able, but difficult.       PT Short Term Goals - 03/26/16 1034    PT SHORT TERM GOAL #1   Title Pt will be I with HEP for initial strength/ROM   Status On-going   PT SHORT TERM GOAL #2   Title Pt will be  able to report less stiffness in AM and with transitions (sit to stand after sitting)   Status On-going   PT SHORT TERM GOAL #3   Title Pt will be able to use and understand good body mechanics and posture for mobility.    Status On-going           PT Long Term Goals - 04/09/16 1010    PT LONG TERM GOAL #1   Title Pt will be I with HEP (more advanced) for core strength.    Status On-going   PT LONG TERM GOAL #2   Title Pt will be able to walk on uneven ground (beach, hike) for 1 hour with min to no pain    Status On-going   PT LONG TERM GOAL #3   Title Pt will increase hip abd and glute med strength to 4/5 for gait stability   Status On-going   PT LONG TERM GOAL #4   Title Pt will be able to walk down steps with good alignment in knee/hips and min cues.    Status On-going               Plan - 04/09/16 1027    Clinical Impression Statement Worked on maintaining  pelvis level during single leg exercises on Reformer.   Trendelenburg Rt>Lt. Progressing towards goals.  Subjectively he feels he is walking a bit better than prior to PT.  Will formally assess next visit.    PT Next Visit Plan glute med work, standing hip ex, sidelying and core stab.  MMT and goals    PT Home Exercise Plan clam, stenosis type stretching and hamstring added PrePilates today . Asked that he do lower ab progression (prepilates) and hip /glute med as a priority.  Step ups require too much cueing.  Needs constant feedback and cues.    Consulted and Agree with Plan of Care Patient      Patient will benefit from skilled therapeutic intervention in order to improve the following deficits and impairments:  Abnormal gait, Decreased range of motion, Increased fascial restricitons, Pain, Impaired flexibility, Hypomobility, Decreased mobility, Decreased strength, Postural dysfunction  Visit Diagnosis: Other abnormalities of gait and mobility  Muscle weakness (generalized)  Midline low back pain without sciatica    G CODE:  Current CJ GOAL CI DC CJ    Problem List Patient Active Problem List   Diagnosis Date Noted  . Prostate cancer ([place]) 11/09/2015  . Spinal stenosis of lumbar region 10/11/2015  . Weakness of right lower extremity 09/13/2015  . Malignant neoplasm of prostate ([place]) 09/06/2015  . 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 Lumbar radiculopathy, chronic 06/22/2015  . Abnormal prostate by palpation 02/24/2015  . Nonspecific ST-T changes 02/16/2013  . Family history of prostate cancer 02/16/2013  . Hyperlipidemia 09/30/2011  . Basal cell cancer 09/30/2011    Makyla Bye 04/09/2016, 10:51 AM  [facility] [address] [place], [place], [address] Phone: [phone]   Fax:  [phone]  Name: Dale Holmes MRN: [id number] Date of Birth: 10-27-1951    Raeford Razor, PT 04/09/2016 10:51 AM Phone:  [phone] Fax: [phone]   PHYSICAL THERAPY DISCHARGE SUMMARY  Visits from Start of Care: 6  Current functional level related to goals / functional outcomes: Patient was progressing towards goals, subjectively reporting less difficulty standing from a sitting position and improving in posture, core strength    Remaining deficits: Gait, lateral hip weakness, core weakness   Education / Equipment:  HEP, lifting, posture and Pilates  Plan: Patient agrees to discharge.  Patient goals were not met. Patient is being discharged due to a change in medical status.  ?????    Patient fell and had to undergo elbow surgery.  He will follow up with me and possible return to PT and/or Pilates when cleared my MD.   Raeford Razor, PT 04/18/2016 2:53 PM Phone: [phone] Fax: [phone]

## 2016-04-11 ENCOUNTER — Encounter: Payer: Medicare Other | Admitting: Physical Therapy

## 2016-04-11 ENCOUNTER — Ambulatory Visit: Payer: Medicare Other

## 2016-04-15 DIAGNOSIS — S52032A Displaced fracture of olecranon process with intraarticular extension of left ulna, initial encounter for closed fracture: Secondary | ICD-10-CM | POA: Diagnosis not present

## 2016-04-16 ENCOUNTER — Encounter: Payer: Medicare Other | Admitting: Physical Therapy

## 2016-04-18 ENCOUNTER — Encounter: Payer: Medicare Other | Admitting: Physical Therapy

## 2016-04-25 DIAGNOSIS — S52032D Displaced fracture of olecranon process with intraarticular extension of left ulna, subsequent encounter for closed fracture with routine healing: Secondary | ICD-10-CM | POA: Diagnosis not present

## 2016-04-30 DIAGNOSIS — S52032D Displaced fracture of olecranon process with intraarticular extension of left ulna, subsequent encounter for closed fracture with routine healing: Secondary | ICD-10-CM | POA: Diagnosis not present

## 2016-05-02 ENCOUNTER — Emergency Department (HOSPITAL_COMMUNITY): Payer: Medicare Other

## 2016-05-02 ENCOUNTER — Emergency Department (HOSPITAL_COMMUNITY)
Admission: EM | Admit: 2016-05-02 | Discharge: 2016-05-02 | Disposition: A | Discharge status: Home / Self Care | Payer: Medicare Other | Attending: Emergency Medicine | Admitting: Emergency Medicine

## 2016-05-02 ENCOUNTER — Encounter (HOSPITAL_COMMUNITY): Payer: Self-pay | Admitting: Emergency Medicine

## 2016-05-02 DIAGNOSIS — K8050 Calculus of bile duct without cholangitis or cholecystitis without obstruction: Secondary | ICD-10-CM | POA: Insufficient documentation

## 2016-05-02 DIAGNOSIS — Z7982 Long term (current) use of aspirin: Secondary | ICD-10-CM | POA: Insufficient documentation

## 2016-05-02 DIAGNOSIS — R1013 Epigastric pain: Secondary | ICD-10-CM

## 2016-05-02 DIAGNOSIS — R079 Chest pain, unspecified: Secondary | ICD-10-CM | POA: Diagnosis not present

## 2016-05-02 DIAGNOSIS — K8020 Calculus of gallbladder without cholecystitis without obstruction: Secondary | ICD-10-CM | POA: Diagnosis not present

## 2016-05-02 DIAGNOSIS — Z8546 Personal history of malignant neoplasm of prostate: Secondary | ICD-10-CM | POA: Diagnosis not present

## 2016-05-02 DIAGNOSIS — Z85828 Personal history of other malignant neoplasm of skin: Secondary | ICD-10-CM | POA: Insufficient documentation

## 2016-05-02 DIAGNOSIS — R110 Nausea: Secondary | ICD-10-CM | POA: Diagnosis not present

## 2016-05-02 LAB — CBC
HEMATOCRIT: 42.3 % (ref 39.0–52.0)
Hemoglobin: 15 g/dL (ref 13.0–17.0)
MCH: 30.3 pg (ref 26.0–34.0)
MCHC: 35.5 g/dL (ref 30.0–36.0)
MCV: 85.5 fL (ref 78.0–100.0)
Platelets: 251 10*3/uL (ref 150–400)
RBC: 4.95 MIL/uL (ref 4.22–5.81)
RDW: 13.2 % (ref 11.5–15.5)
WBC: 10 10*3/uL (ref 4.0–10.5)

## 2016-05-02 LAB — BASIC METABOLIC PANEL
Anion gap: 9 (ref 5–15)
BUN: 21 mg/dL — AB (ref 6–20)
CALCIUM: 9.4 mg/dL (ref 8.9–10.3)
CO2: 25 mmol/L (ref 22–32)
CREATININE: 1.14 mg/dL (ref 0.61–1.24)
Chloride: 105 mmol/L (ref 101–111)
GFR calc Af Amer: >60 mL/min (ref >60)
GLUCOSE: 133 mg/dL — AB (ref 65–99)
Potassium: 3.9 mmol/L (ref 3.5–5.1)
Sodium: 139 mmol/L (ref 135–145)

## 2016-05-02 LAB — HEPATIC FUNCTION PANEL
ALT: 21 U/L (ref 17–63)
AST: 25 U/L (ref 15–41)
Albumin: 4.6 g/dL (ref 3.5–5.0)
Alkaline Phosphatase: 60 U/L (ref 38–126)
BILIRUBIN DIRECT: 0.1 mg/dL (ref 0.1–0.5)
BILIRUBIN INDIRECT: 0.6 mg/dL (ref 0.3–0.9)
BILIRUBIN TOTAL: 0.7 mg/dL (ref 0.3–1.2)
Total Protein: 7.3 g/dL (ref 6.5–8.1)

## 2016-05-02 LAB — I-STAT TROPONIN, ED: TROPONIN I, POC: 0 ng/mL (ref 0.00–0.08)

## 2016-05-02 LAB — LIPASE, BLOOD: Lipase: 31 U/L (ref 11–51)

## 2016-05-02 MED ORDER — OMEPRAZOLE 20 MG PO CPDR: 20.0000 mg | DELAYED_RELEASE_CAPSULE | Freq: Every day | ORAL | Status: AC

## 2016-05-02 MED ORDER — GI COCKTAIL ~~LOC~~
30.0000 mL | Freq: Once | ORAL | Status: AC
  Administered 2016-05-02: 30 mL via ORAL
  Filled 2016-05-02: qty 30

## 2016-05-02 NOTE — Discharge Instructions (Signed)
Follow-up with your primary care provider regarding your visit to the emergency department today. I have included contact information for a gastroenterologist and a general surgeon. You can follow up with them on your own or discuss a referral with your primary care provider.  Avoid fatty foods. And take the Prilosec as prescribed. Avoid Advil/ibuprofen until you speaks your primary care provider.  Return to emergency department if you experience worsening abdominal pain, nausea, vomiting, fever, chills, blood in your vomit or blood in your stools, or severe back pain.  Cholelithiasis Cholelithiasis (also called gallstones) is a form of gallbladder disease in which gallstones form in your gallbladder. The gallbladder is an organ that stores bile made in the liver, which helps digest fats. Gallstones begin as small crystals and slowly grow into stones. Gallstone pain occurs when the gallbladder spasms and a gallstone is blocking the duct. Pain can also occur when a stone passes out of the duct.  RISK FACTORS  Being male.   Having multiple pregnancies. Health care providers sometimes advise removing diseased gallbladders before future pregnancies.   Being obese.  Eating a diet heavy in fried foods and fat.   Being older than 60 years and increasing age.   Prolonged use of medicines containing male hormones.   Having diabetes mellitus.   Rapidly losing weight.   Having a family history of gallstones (heredity).  SYMPTOMS  Nausea.   Vomiting.  Abdominal pain.   Yellowing of the skin (jaundice).   Sudden pain. It may persist from several minutes to several hours.  Fever.   Tenderness to the touch. In some cases, when gallstones do not move into the bile duct, people have no pain or symptoms. These are called "silent" gallstones.  TREATMENT Silent gallstones do not need treatment. In severe cases, emergency surgery may be required. Options for treatment  include:  Surgery to remove the gallbladder. This is the most common treatment.  Medicines. These do not always work and may take 6-12 months or more to work.  Shock wave treatment (extracorporeal biliary lithotripsy). In this treatment an ultrasound machine sends shock waves to the gallbladder to break gallstones into smaller pieces that can pass into the intestines or be dissolved by medicine. HOME CARE INSTRUCTIONS   Only take over-the-counter or prescription medicines for pain, discomfort, or fever as directed by your health care provider.   Follow a low-fat diet until seen again by your health care provider. Fat causes the gallbladder to contract, which can result in pain.   Follow up with your health care provider as directed. Attacks are almost always recurrent and surgery is usually required for permanent treatment.  SEEK IMMEDIATE MEDICAL CARE IF:   Your pain increases and is not controlled by medicines.   You have a fever or persistent symptoms for more than 2-3 days.   You have a fever and your symptoms suddenly get worse.   You have persistent nausea and vomiting.  MAKE SURE YOU:   Understand these instructions. Abdominal Pain, Adult Many things can cause abdominal pain. Usually, abdominal pain is not caused by a disease and will improve without treatment. It can often be observed and treated at home. Your health care provider will do a physical exam and possibly order blood tests and X-rays to help determine the seriousness of your pain. However, in many cases, more time must pass before a clear cause of the pain can be found. Before that point, your health care provider may not know if you  need more testing or further treatment. HOME CARE INSTRUCTIONS Monitor your abdominal pain for any changes. The following actions may help to alleviate any discomfort you are experiencing: Only take over-the-counter or prescription medicines as directed by your health care  provider. Do not take laxatives unless directed to do so by your health care provider. Try a clear liquid diet (broth, tea, or water) as directed by your health care provider. Slowly move to a bland diet as tolerated. SEEK MEDICAL CARE IF: You have unexplained abdominal pain. You have abdominal pain associated with nausea or diarrhea. You have pain when you urinate or have a bowel movement. You experience abdominal pain that wakes you in the night. You have abdominal pain that is worsened or improved by eating food. You have abdominal pain that is worsened with eating fatty foods. You have a fever. SEEK IMMEDIATE MEDICAL CARE IF: Your pain does not go away within 2 hours. You keep throwing up (vomiting). Your pain is felt only in portions of the abdomen, such as the right side or the left lower portion of the abdomen. You pass bloody or black tarry stools. MAKE SURE YOU: Understand these instructions. Will watch your condition. Will get help right away if you are not doing well or get worse.   This information is not intended to replace advice given to you by your health care provider. Make sure you discuss any questions you have with your health care provider.   Document Released: 08/21/2005 Document Revised: 08/02/2015 Document Reviewed: 07/21/2013 Elsevier Interactive Patient Education [employer].   Will watch your condition.  Will get help right away if you are not doing well or get worse.   This information is not intended to replace advice given to you by your health care provider. Make sure you discuss any questions you have with your health care provider.   Document Released: 11/07/2005 Document Revised: 07/14/2013 Document Reviewed: 05/05/2013 Elsevier Interactive Patient Education [employer].

## 2016-05-02 NOTE — ED Notes (Addendum)
Pt reports lower CP/epigastric pain since eating at 1100 this am. Pain has become deeper and lower since then. No SOB or dizziness. Pain worse with lying down and movement

## 2016-05-02 NOTE — ED Provider Notes (Signed)
CSN: [id number]     Arrival date & time 05/02/16  1723 History   First MD Initiated Contact with Patient 05/02/16 1843     Chief Complaint  Patient presents with  . Chest Pain     (Consider location/radiation/quality/duration/timing/severity/associated sxs/prior Treatment) HPI   Patient is a 65 year old male with a history of hyperlipidemia, prostate cancer who presents the ED with worsening epigastric pain since 11 AM this morning. Patient states he started to experience mild epigastric pain has progressively gotten worse throughout the day. Pain was worse after eating lunch. Pain is all, constant, 8/10, has radiated to the middle of his back. Food makes it worse. He took 2 Advil today at noon which did not help. Associated nausea. Patient states he has intermittent indigestion and the pain today felt like that initially but got progressively worse. Patient takes 2 Advil 3-4 times a week for many years. He also drinks 2 glasses of wine per night. He states his mother died of a AAA at age 95. He denies vomiting, chest pain, shortness of breath, changes in bowel or bladder habits, fever, chills, recent illness.  Past Medical History  Diagnosis Date  . Hyperlipidemia 2007    NMR LDL goal = < 100  . Herniated disc, cervical     history of  . Hx of basal cell carcinoma   . Hx of cardiac arrhythmia   . Hx of hypercholesterolemia   . Hx of cardiac murmur   . Dysrhythmia     irregular heart beat   . Prostate cancer ([place]) 08/22/15  . Skin cancer    Past Surgical History  Procedure Laterality Date  . Appendectomy    . Inguinal heriorrhaphy      R  . Colonoscopy  2004    negative; Dr Fuller Plan  . Prostate biopsy  08/22/2015  . Robot assisted laparoscopic radical prostatectomy N/A 11/09/2015    Procedure: ROBOTIC ASSISTED LAPAROSCOPIC RADICAL PROSTATECTOMY LEVEL 2;  Surgeon: Raynelle Bring, MD;  Location: [facility];  Service: Urology;  Laterality: N/A;  . Lymphadenectomy Bilateral 11/09/2015   Procedure: PELVIC LYMPHADENECTOMY;  Surgeon: Raynelle Bring, MD;  Location: [facility];  Service: Urology;  Laterality: Bilateral;   Family History  Problem Relation Age of Onset  . Hypertension Mother   . Heart attack Father 17  . Prostate cancer Father     PROSTATE, COLON  . Colon cancer Father 48  . Prostate cancer Brother   . Alcohol abuse Brother   . Transient ischemic attack Brother 44  . Diabetes Neg Hx   . Other Brother     elevated PSA   Social History  Substance Use Topics  . Smoking status: Never Smoker   . Smokeless tobacco: Never Used  . Alcohol Use: 8.4 oz/week    14 Glasses of wine per week    Review of Systems  Constitutional: Negative for unexpected weight change.  HENT: Negative for trouble swallowing.   Respiratory: Negative for cough, chest tightness and shortness of breath.   Cardiovascular: Negative for chest pain and leg swelling.  Gastrointestinal: Positive for nausea and abdominal pain. Negative for vomiting, diarrhea, blood in stool and abdominal distention.  Genitourinary: Negative for dysuria and hematuria.  Musculoskeletal: Positive for back pain. Negative for myalgias and neck pain.  Skin: Negative for rash.  Neurological: Negative for dizziness, syncope, weakness and headaches.  Psychiatric/Behavioral: Negative for confusion.      Allergies  Review of patient's allergies indicates no known allergies.  Home Medications  Prior to Admission medications   Medication Sig Start Date End Date Taking? Authorizing Provider  aspirin EC 325 MG tablet Take 325 mg by mouth daily.   Yes Historical Provider, MD  CIALIS 5 MG tablet Take 5 mg by mouth every evening.  04/01/16  Yes Historical Provider, MD  gabapentin (NEURONTIN) 300 MG capsule Take 1 capsule (300 mg total) by mouth 2 (two) times daily. 03/11/16  Yes Stefanie Libel, MD  ibuprofen (ADVIL,MOTRIN) 200 MG tablet Take 400 mg by mouth 2 (two) times daily as needed for moderate pain.   Yes Historical  Provider, MD  Multiple Vitamin (MULTIVITAMIN WITH MINERALS) TABS tablet Take 1 tablet by mouth daily.   Yes Historical Provider, MD  rosuvastatin (CRESTOR) 10 MG tablet Take 10 mg by mouth daily.  02/28/16  Yes Historical Provider, MD  omeprazole (PRILOSEC) 20 MG capsule Take 1 capsule (20 mg total) by mouth daily. 05/02/16   [place] Dann L Darrly Loberg, PA   BP 111/78 mmHg  Pulse 60  Temp(Src) 97.7 F (36.5 C) (Oral)  Resp 16  SpO2 100% Physical Exam  Constitutional: He appears well-developed and well-nourished. No distress.  HENT:  Head: Normocephalic and atraumatic.  Eyes: Conjunctivae are normal.  Neck: Normal range of motion. Neck supple.  Cardiovascular: Normal rate, regular rhythm and normal heart sounds.  Exam reveals no gallop and no friction rub.   No murmur heard. Pulses:      Radial pulses are 2+ on the right side, and 2+ on the left side.       Dorsalis pedis pulses are 2+ on the right side, and 2+ on the left side.  Pulmonary/Chest: Effort normal and breath sounds normal. No respiratory distress. He has no wheezes. He has no rales.  Abdominal: Soft. Bowel sounds are normal. He exhibits no distension and no pulsatile midline mass. There is no hepatomegaly. There is no rigidity, no rebound, no guarding, no CVA tenderness, no tenderness at McBurney's point and negative Murphy's sign.  Very mild epigastric tenderness on palpation  Musculoskeletal: Normal range of motion. He exhibits no edema.  Examination of the spine revealed no deformities, no step-offs no TTP as cervical, thoracic or lumbar spine, no paraspinal muscle tenderness, back pain, not reducible on exam  Neurological: He is alert. Coordination normal.  Skin: Skin is warm and dry. He is not diaphoretic.  Psychiatric: He has a normal mood and affect. His behavior is normal.    ED Course  Procedures (including critical care time) Labs Review Labs Reviewed  BASIC METABOLIC PANEL - Abnormal; Notable for the following:     Glucose, Bld 133 (*)    BUN 21 (*)    All other components within normal limits  CBC  HEPATIC FUNCTION PANEL  LIPASE, BLOOD  I-STAT TROPOININ, ED    Imaging Review Dg Chest 2 View  05/02/2016  CLINICAL DATA:  Chest pain, epigastric pain.  Worse with movement. EXAM: CHEST  2 VIEW COMPARISON:  CT chest 11/07/2015 FINDINGS: The heart size and mediastinal contours are within normal limits. Both lungs are clear. The visualized skeletal structures are unremarkable. IMPRESSION: No active cardiopulmonary disease. Electronically Signed   By: Kathreen Devoid   On: 05/02/2016 18:19   US Abdomen Complete  05/02/2016  CLINICAL DATA:  65 year old male with epigastric abdominal pain. EXAM: ABDOMEN ULTRASOUND COMPLETE COMPARISON:  None. FINDINGS: Gallbladder: There is stone within the gallbladder. Echogenic material along the gallbladder wall most likely represent gallbladder sludge/sludge ball. There is no gallbladder wall thickening or  pericholecystic. Common bile duct: Diameter: 4 mm Liver: No focal lesion identified. Within normal limits in parenchymal echogenicity. IVC: No abnormality visualized. Pancreas: Grossly unremarkable as visualized. Spleen: Size and appearance within normal limits. Right Kidney: Length: 9 cm. Echogenicity within normal limits. No mass or hydronephrosis visualized. Left Kidney: Length: 11 send. Echogenicity within normal limits. No mass or hydronephrosis visualized. Abdominal aorta: No aneurysm visualized. Other findings: None. IMPRESSION: Gallstones and sludge without sonographic evidence acute cholecystitis. Otherwise unremarkable abdominal ultrasound. Electronically Signed   By: Anner Crete M.D.   On: 05/02/2016 20:27   I have personally reviewed and evaluated these images and lab results as part of my medical decision-making.   EKG Interpretation   Date/Time:  Thursday May 02 2016 17:33:48 EDT Ventricular Rate:  57 PR Interval:  152 QRS Duration: 93 QT Interval:  426 QTC  Calculation: 415 R Axis:   59 Text Interpretation:  Sinus rhythm Normal ECG no significant change since  Dec 2016 Confirmed by GOLDSTON MD, SCOTT [phone]) on 05/02/2016 7:17:58 PM      MDM   Final diagnoses:  Biliary colic  Epigastric pain  Nausea   Patient is nontoxic, nonseptic appearing, in no apparent distress.  Patient's Pain other symptoms well controlled in the emergency department.  Labs, imaging and vitals reviewed. Patient stable at time of discharge, VSS. Patient does not meet the SIRS or Sepsis criteria.  On repeat exam patient does not have a surgical abdomin and there are no peritoneal signs.  No indication of appendicitis, bowel obstruction, bowel perforation, cholecystitis, diverticulitis.  Patient EKG and chest x-ray revealed no acute abnormalities, negative troponin. Patient's ultrasound did reveal gallstones and sludge without evidence of acute cholecystitis. Ultrasound revealed no abdominal aortic aneurysm visualized. Patient has a history of NSAID use and alcohol use and his epigastric pain could be 2/2 an ulcer or gastritis. Patient states his epigastric pain improved with a GI cocktail. Pain could also be biliary colic.   Patient discharged home with PPI and given strict instructions for follow-up with their primary care physician. I also discussed with patient that he should follow-up with a general surgeon regarding his gallbladder and a GI specialist regarding his epigastric pain.    I have also discussed reasons to return immediately to the ER.  Patient expresses understanding and agrees with plan.       Kalman Drape, PA 05/03/16 [id number]  Sherwood Gambler, MD 05/06/16 [phone]

## 2016-05-03 ENCOUNTER — Other Ambulatory Visit: Payer: Self-pay | Admitting: Surgery

## 2016-05-03 DIAGNOSIS — K810 Acute cholecystitis: Secondary | ICD-10-CM | POA: Diagnosis not present

## 2016-05-03 DIAGNOSIS — R1013 Epigastric pain: Secondary | ICD-10-CM | POA: Diagnosis not present

## 2016-05-03 DIAGNOSIS — Z6824 Body mass index (BMI) 24.0-24.9, adult: Secondary | ICD-10-CM | POA: Diagnosis not present

## 2016-05-03 NOTE — H&P (Signed)
Dale Holmes 05/03/2016 4:18 PM Location: [facility] Patient #: [id number] DOB: November 10, 1951 Married / Language: English / Race: White Male   History of Present Illness   Patient words: Gallbladder.   The patient is a 65 year old male who presents with a complaint of Gall bladder disease.   His PCP is Dr. Jerilynn Mages. Per[place].  He comes with his wife.   The patient has had a known gallstone for at least 15 years. But it was basically asymptomatic, therefore he left it alone. At times he would feel a catch in his epigastrium, but this was quickly get better.  Then yesterday he developed epigastric abdominal pain which migrated to his right upper quadrant. The pain was fairly severe and he went to the [facility] long emergency room.  His evaluation there showed a normal white blood count and liver functions. He had an [place] on 05/02/2016 which showed cholelithiais and sludge. The main gallstone is almost 3 cm.  He went to Dr. Silvestre Mesi office today and was seen by Colletta Maryland, his PA.  A repeat WBC was now 15,000 and he was referred to our office.  He has no history of stomach, liver, pancreas disease. He's had a prior colonoscopy several years ago by Dr. Fuller Plan.  He had some hydrocodone from his elbow fx, so he took this to control his pain. He is sore today, but his abdominal pain is not as bad as last PM.  Past Medical History: 1. Prostate cancer - robotic prostatectomy - 11/09/2015 - Dr. Alinda Money for urology He is doing well and apparently this was early ca. His brother, Morris Bergschneider, has prostate cancer about 8 years ago and apparently has it to his bone. 2. Gi - Dr. Fuller Plan 3. Broke left elbow which required surgery by Dr. Berenice Primas about 3 weeks ago. 4. Spinal stenosis issues  Social History: Married. (wife - Lauren) Has 2 sons, Cristie Hem (22) and Ruthann Cancer (30) did run a [employer] Patsey Berthold, [place]; 05/03/2016 4:19 PM) No Known Drug  Allergies06/07/2016  Medication History Patsey Berthold, CMA; 05/03/2016 4:20 PM) Cialis (5MG  Tablet, Oral daily) Active. Rosuvastatin Calcium (10MG  Tablet, Oral daily) Active. Gabapentin (300MG  Capsule, Oral two times daily) Active. Aspirin (325MG  Tablet, Oral daily) Active. Medications Reconciled    Review of Systems Patsey Berthold CMA; 05/03/2016 4:18 PM) HEENT Not Present- Earache, Hearing Loss, Hoarseness, Nose Bleed, Oral Ulcers, Ringing in the Ears, Seasonal Allergies, Sinus Pain, Sore Throat, Visual Disturbances, Wears glasses/contact lenses and Yellow Eyes. Gastrointestinal Present- Abdominal Pain. Not Present- Bloating, Bloody Stool, Change in Bowel Habits, Chronic diarrhea, Constipation, Difficulty Swallowing, Excessive gas, Gets full quickly at meals, Hemorrhoids, Indigestion, Nausea, Rectal Pain and Vomiting.  Vitals Patsey Berthold CMA; 05/03/2016 4:21 PM) 05/03/2016 4:20 PM Weight: 186 lb Height: 71in Height was reported by patient. Body Surface Area: 2.04 m Body Mass Index: 25.94 kg/m  Temp.: 99.43F  Pulse: 83 (Regular)  BP: 122/74 (Sitting, Left Arm, Standard)   Physical Exam  General: WM alert and generally healthy appearing. He holds his right side and looks a little uncomfortable when he walks. HEENT: Normal. Pupils equal.  Neck: Supple. No mass. No thyroid mass. Lymph Nodes: No supraclavicular or cervical nodes.  Lungs: Clear to auscultation and symmetric breath sounds. Heart: RRR. No murmur or rub.  Abdomen: Soft. No mass. No hernia. Normal bowel sounds.  Has multiple scars from the lap prostate surgery.  Has RLQ scar from appendectomy as a teenager.  Sore in RUQ  Extremities:  Left elbow in split from recent surgery  Neurologic: Grossly intact to motor and sensory function. Psychiatric: Has normal mood and affect. Behavior is normal.  Assessment & Plan  CHOLECYSTITIS, ACUTE (K81.0)  Plan:   1) Come to [facility] ER at 6:30 AM to register for  surgery - which his scheduled at 8:00 AM   2)  Plan cholecystectomy in AM   Alphonsa Overall, MD, [facility] Pager: [phone] Office phone:  [phone]

## 2016-05-04 ENCOUNTER — Observation Stay (HOSPITAL_COMMUNITY)
Admission: RE | Admit: 2016-05-04 | Discharge: 2016-05-05 | Disposition: A | Discharge status: Home / Self Care | Payer: Medicare Other | Source: Ambulatory Visit | Attending: Surgery | Admitting: Surgery

## 2016-05-04 ENCOUNTER — Encounter (HOSPITAL_COMMUNITY): Payer: Self-pay | Admitting: Anesthesiology

## 2016-05-04 ENCOUNTER — Inpatient Hospital Stay (HOSPITAL_COMMUNITY): Payer: Medicare Other | Admitting: Anesthesiology

## 2016-05-04 ENCOUNTER — Encounter (HOSPITAL_COMMUNITY)
Admission: RE | Disposition: A | Discharge status: Home / Self Care | Payer: Self-pay | Source: Ambulatory Visit | Attending: Surgery

## 2016-05-04 ENCOUNTER — Inpatient Hospital Stay (HOSPITAL_COMMUNITY): Payer: Medicare Other

## 2016-05-04 DIAGNOSIS — Z79899 Other long term (current) drug therapy: Secondary | ICD-10-CM | POA: Diagnosis not present

## 2016-05-04 DIAGNOSIS — K8000 Calculus of gallbladder with acute cholecystitis without obstruction: Secondary | ICD-10-CM | POA: Diagnosis not present

## 2016-05-04 DIAGNOSIS — K8020 Calculus of gallbladder without cholecystitis without obstruction: Secondary | ICD-10-CM | POA: Diagnosis not present

## 2016-05-04 DIAGNOSIS — Z8546 Personal history of malignant neoplasm of prostate: Secondary | ICD-10-CM | POA: Diagnosis not present

## 2016-05-04 DIAGNOSIS — Z7982 Long term (current) use of aspirin: Secondary | ICD-10-CM | POA: Diagnosis not present

## 2016-05-04 DIAGNOSIS — K8071 Calculus of gallbladder and bile duct without cholecystitis with obstruction: Secondary | ICD-10-CM

## 2016-05-04 DIAGNOSIS — K819 Cholecystitis, unspecified: Secondary | ICD-10-CM | POA: Diagnosis not present

## 2016-05-04 DIAGNOSIS — R1013 Epigastric pain: Secondary | ICD-10-CM | POA: Diagnosis present

## 2016-05-04 HISTORY — PX: CHOLECYSTECTOMY: SHX55

## 2016-05-04 SURGERY — LAPAROSCOPIC CHOLECYSTECTOMY WITH INTRAOPERATIVE CHOLANGIOGRAM
Anesthesia: General | Site: Abdomen

## 2016-05-04 MED ORDER — ROCURONIUM BROMIDE 100 MG/10ML IV SOLN
INTRAVENOUS | Status: AC
  Filled 2016-05-04: qty 1

## 2016-05-04 MED ORDER — GABAPENTIN 300 MG PO CAPS: 300.0000 mg | ORAL_CAPSULE | Freq: Two times a day (BID) | ORAL | Status: DC

## 2016-05-04 MED ORDER — HYDROCODONE-ACETAMINOPHEN 5-325 MG PO TABS
1.0000 | ORAL_TABLET | ORAL | Status: DC | PRN
  Administered 2016-05-04: 1 via ORAL
  Administered 2016-05-04 – 2016-05-05 (×3): 2 via ORAL
  Filled 2016-05-04: qty 2
  Filled 2016-05-04: qty 1
  Filled 2016-05-04 (×2): qty 2

## 2016-05-04 MED ORDER — SUGAMMADEX SODIUM 200 MG/2ML IV SOLN
INTRAVENOUS | Status: DC | PRN
  Administered 2016-05-04: 200 mg via INTRAVENOUS

## 2016-05-04 MED ORDER — SUFENTANIL CITRATE 50 MCG/ML IV SOLN
INTRAVENOUS | Status: AC
  Filled 2016-05-04: qty 1

## 2016-05-04 MED ORDER — PROPOFOL 10 MG/ML IV BOLUS
INTRAVENOUS | Status: DC | PRN
  Administered 2016-05-04: 150 mg via INTRAVENOUS

## 2016-05-04 MED ORDER — BUPIVACAINE HCL (PF) 0.25 % IJ SOLN
INTRAMUSCULAR | Status: AC
  Filled 2016-05-04: qty 30

## 2016-05-04 MED ORDER — ONDANSETRON HCL 4 MG/2ML IJ SOLN: 4.0000 mg | Freq: Four times a day (QID) | INTRAMUSCULAR | Status: DC | PRN

## 2016-05-04 MED ORDER — OXYCODONE HCL 5 MG PO TABS: 5.0000 mg | ORAL_TABLET | Freq: Once | ORAL | Status: DC | PRN

## 2016-05-04 MED ORDER — SUGAMMADEX SODIUM 200 MG/2ML IV SOLN
INTRAVENOUS | Status: AC
  Filled 2016-05-04: qty 2

## 2016-05-04 MED ORDER — ONDANSETRON HCL 4 MG/2ML IJ SOLN
INTRAMUSCULAR | Status: AC
  Filled 2016-05-04: qty 2

## 2016-05-04 MED ORDER — LIDOCAINE HCL (CARDIAC) 20 MG/ML IV SOLN
INTRAVENOUS | Status: DC | PRN
  Administered 2016-05-04: 100 mg via INTRAVENOUS

## 2016-05-04 MED ORDER — HYDROMORPHONE HCL 2 MG/ML IJ SOLN
INTRAMUSCULAR | Status: AC
  Filled 2016-05-04: qty 1

## 2016-05-04 MED ORDER — CEFAZOLIN SODIUM-DEXTROSE 2-4 GM/100ML-% IV SOLN
INTRAVENOUS | Status: AC
  Filled 2016-05-04: qty 100

## 2016-05-04 MED ORDER — ONDANSETRON 4 MG PO TBDP: 4.0000 mg | ORAL_TABLET | Freq: Four times a day (QID) | ORAL | Status: DC | PRN

## 2016-05-04 MED ORDER — LACTATED RINGERS IV SOLN
INTRAVENOUS | Status: DC | PRN
  Administered 2016-05-04 (×2): via INTRAVENOUS

## 2016-05-04 MED ORDER — ONDANSETRON HCL 4 MG/2ML IJ SOLN
INTRAMUSCULAR | Status: DC | PRN
  Administered 2016-05-04: 4 mg via INTRAVENOUS

## 2016-05-04 MED ORDER — SODIUM CHLORIDE 0.9 % IJ SOLN
INTRAMUSCULAR | Status: AC
  Filled 2016-05-04: qty 10

## 2016-05-04 MED ORDER — ONDANSETRON HCL 4 MG/2ML IJ SOLN: 4.0000 mg | Freq: Once | INTRAMUSCULAR | Status: DC | PRN

## 2016-05-04 MED ORDER — DEXTROSE 5 % IV SOLN
2.0000 g | INTRAVENOUS | Status: DC
  Administered 2016-05-04: 2 g via INTRAVENOUS
  Filled 2016-05-04 (×2): qty 2

## 2016-05-04 MED ORDER — EPHEDRINE SULFATE 50 MG/ML IJ SOLN
INTRAMUSCULAR | Status: DC | PRN
  Administered 2016-05-04: 10 mg via INTRAVENOUS

## 2016-05-04 MED ORDER — HEPARIN SODIUM (PORCINE) 5000 UNIT/ML IJ SOLN: 5000.0000 [IU] | Freq: Three times a day (TID) | INTRAMUSCULAR | Status: DC

## 2016-05-04 MED ORDER — LACTATED RINGERS IV SOLN
INTRAVENOUS | Status: DC | PRN
  Administered 2016-05-04: 5000 mL
  Administered 2016-05-04: 1000 mL

## 2016-05-04 MED ORDER — KCL IN DEXTROSE-NACL 20-5-0.45 MEQ/L-%-% IV SOLN
INTRAVENOUS | Status: DC
  Administered 2016-05-04 (×2): via INTRAVENOUS
  Filled 2016-05-04 (×3): qty 1000

## 2016-05-04 MED ORDER — CEFAZOLIN SODIUM-DEXTROSE 2-4 GM/100ML-% IV SOLN
2.0000 g | INTRAVENOUS | Status: AC
  Administered 2016-05-04: 2 g via INTRAVENOUS
  Filled 2016-05-04: qty 100

## 2016-05-04 MED ORDER — DEXAMETHASONE SODIUM PHOSPHATE 10 MG/ML IJ SOLN
INTRAMUSCULAR | Status: AC
  Filled 2016-05-04: qty 1

## 2016-05-04 MED ORDER — DEXAMETHASONE SODIUM PHOSPHATE 10 MG/ML IJ SOLN
INTRAMUSCULAR | Status: DC | PRN
  Administered 2016-05-04: 10 mg via INTRAVENOUS

## 2016-05-04 MED ORDER — FENTANYL CITRATE (PF) 100 MCG/2ML IJ SOLN: 25.0000 ug | INTRAMUSCULAR | Status: DC | PRN

## 2016-05-04 MED ORDER — CHLORHEXIDINE GLUCONATE 4 % EX LIQD: 60.0000 mL | Freq: Once | CUTANEOUS | Status: DC

## 2016-05-04 MED ORDER — MORPHINE SULFATE (PF) 2 MG/ML IV SOLN
1.0000 mg | INTRAVENOUS | Status: DC | PRN
  Administered 2016-05-04: 2 mg via INTRAVENOUS
  Filled 2016-05-04: qty 1

## 2016-05-04 MED ORDER — OXYCODONE HCL 5 MG/5ML PO SOLN: 5.0000 mg | Freq: Once | ORAL | Status: DC | PRN

## 2016-05-04 MED ORDER — IBUPROFEN 400 MG PO TABS: 600.0000 mg | ORAL_TABLET | Freq: Four times a day (QID) | ORAL | Status: DC | PRN

## 2016-05-04 MED ORDER — HYDROMORPHONE HCL 1 MG/ML IJ SOLN
INTRAMUSCULAR | Status: DC | PRN
  Administered 2016-05-04 (×2): .4 mg via INTRAVENOUS

## 2016-05-04 MED ORDER — PROPOFOL 10 MG/ML IV BOLUS
INTRAVENOUS | Status: AC
  Filled 2016-05-04: qty 20

## 2016-05-04 MED ORDER — SUFENTANIL CITRATE 50 MCG/ML IV SOLN
INTRAVENOUS | Status: DC | PRN
  Administered 2016-05-04: 5 ug via INTRAVENOUS
  Administered 2016-05-04 (×2): 10 ug via INTRAVENOUS
  Administered 2016-05-04: 25 ug via INTRAVENOUS

## 2016-05-04 MED ORDER — IOPAMIDOL (ISOVUE-300) INJECTION 61%
INTRAVENOUS | Status: AC
  Filled 2016-05-04: qty 50

## 2016-05-04 MED ORDER — EPHEDRINE SULFATE 50 MG/ML IJ SOLN
INTRAMUSCULAR | Status: AC
  Filled 2016-05-04: qty 1

## 2016-05-04 MED ORDER — LIDOCAINE HCL (CARDIAC) 20 MG/ML IV SOLN
INTRAVENOUS | Status: AC
  Filled 2016-05-04: qty 5

## 2016-05-04 MED ORDER — SUCCINYLCHOLINE CHLORIDE 20 MG/ML IJ SOLN
INTRAMUSCULAR | Status: DC | PRN
  Administered 2016-05-04: 100 mg via INTRAVENOUS

## 2016-05-04 MED ORDER — IOPAMIDOL (ISOVUE-300) INJECTION 61%
INTRAVENOUS | Status: DC | PRN
  Administered 2016-05-04: 5 mL

## 2016-05-04 MED ORDER — ROCURONIUM BROMIDE 100 MG/10ML IV SOLN
INTRAVENOUS | Status: DC | PRN
  Administered 2016-05-04 (×2): 10 mg via INTRAVENOUS
  Administered 2016-05-04: 30 mg via INTRAVENOUS

## 2016-05-04 SURGICAL SUPPLY — 40 items
APPLIER CLIP 5 13 M/L LIGAMAX5 (MISCELLANEOUS)
APPLIER CLIP ROT 10 11.4 M/L (STAPLE) ×3
BENZOIN TINCTURE PRP APPL 2/3 (GAUZE/BANDAGES/DRESSINGS) ×3 IMPLANT
CABLE HIGH FREQUENCY MONO STRZ (ELECTRODE) ×3 IMPLANT
CHLORAPREP W/TINT 26ML (MISCELLANEOUS) ×3 IMPLANT
CHOLANGIOGRAM CATH TAUT (CATHETERS) ×3 IMPLANT
CLIP APPLIE 5 13 M/L LIGAMAX5 (MISCELLANEOUS) IMPLANT
CLIP APPLIE ROT 10 11.4 M/L (STAPLE) ×1 IMPLANT
CLOSURE WOUND 1/4X4 (GAUZE/BANDAGES/DRESSINGS) ×1
COVER MAYO STAND STRL (DRAPES) IMPLANT
COVER SURGICAL LIGHT HANDLE (MISCELLANEOUS) ×3 IMPLANT
DECANTER SPIKE VIAL GLASS SM (MISCELLANEOUS) ×3 IMPLANT
DRAPE C-ARM 42X120 X-RAY (DRAPES) ×3 IMPLANT
DRAPE LAPAROSCOPIC ABDOMINAL (DRAPES) ×3 IMPLANT
ELECT REM PT RETURN 9FT ADLT (ELECTROSURGICAL) ×3
ELECTRODE REM PT RTRN 9FT ADLT (ELECTROSURGICAL) ×1 IMPLANT
GLOVE SURG SIGNA 7.5 PF LTX (GLOVE) ×3 IMPLANT
GOWN STRL REUS W/TWL XL LVL3 (GOWN DISPOSABLE) ×9 IMPLANT
HEMOSTAT SURGICEL 4X8 (HEMOSTASIS) IMPLANT
IV CATH 14GX2 1/4 (CATHETERS) ×3 IMPLANT
IV SET EXTENSION CATH 6 NF (IV SETS) ×3 IMPLANT
KIT BASIN OR (CUSTOM PROCEDURE TRAY) ×3 IMPLANT
LIQUID BAND (GAUZE/BANDAGES/DRESSINGS) IMPLANT
POSITIONER SURGICAL ARM (MISCELLANEOUS) IMPLANT
POUCH RETRIEVAL ECOSAC 10 (ENDOMECHANICALS) ×1 IMPLANT
POUCH RETRIEVAL ECOSAC 10MM (ENDOMECHANICALS) ×2
POUCH SPECIMEN RETRIEVAL 10MM (ENDOMECHANICALS) ×3 IMPLANT
SCISSORS LAP 5X35 DISP (ENDOMECHANICALS) ×3 IMPLANT
SET IRRIG TUBING LAPAROSCOPIC (IRRIGATION / IRRIGATOR) ×3 IMPLANT
SLEEVE XCEL OPT CAN 5 100 (ENDOMECHANICALS) ×3 IMPLANT
STOPCOCK 4 WAY LG BORE MALE ST (IV SETS) ×3 IMPLANT
STRIP CLOSURE SKIN 1/4X4 (GAUZE/BANDAGES/DRESSINGS) ×2 IMPLANT
SUT MNCRL AB 4-0 PS2 18 (SUTURE) ×3 IMPLANT
TAPE CLOTH 4X10 WHT NS (GAUZE/BANDAGES/DRESSINGS) IMPLANT
TOWEL OR 17X26 10 PK STRL BLUE (TOWEL DISPOSABLE) ×3 IMPLANT
TRAY LAPAROSCOPIC (CUSTOM PROCEDURE TRAY) ×3 IMPLANT
TROCAR BLADELESS OPT 5 100 (ENDOMECHANICALS) ×3 IMPLANT
TROCAR XCEL BLUNT TIP 100MML (ENDOMECHANICALS) ×3 IMPLANT
TROCAR XCEL NON-BLD 11X100MML (ENDOMECHANICALS) ×3 IMPLANT
TUBING INSUF HEATED (TUBING) ×3 IMPLANT

## 2016-05-04 NOTE — Transfer of Care (Signed)
Immediate Anesthesia Transfer of Care Note  Patient: Dale Holmes  Procedure(s) Performed: Procedure(s): LAPAROSCOPIC CHOLECYSTECTOMY WITH INTRAOPERATIVE CHOLANGIOGRAM (N/A)  Patient Location: PACU  Anesthesia Type:General  Level of Consciousness: awake, alert  and oriented  Airway & Oxygen Therapy: Patient Spontanous Breathing and Patient connected to face mask oxygen  Post-op Assessment: Report given to RN and Post -op Vital signs reviewed and stable  Post vital signs: Reviewed and stable  Last Vitals:  Filed Vitals:   05/04/16 0648  BP: 99/59  Pulse: 83  Temp: 36.8 C  Resp: 15    Last Pain: There were no vitals filed for this visit.       Complications: No apparent anesthesia complications

## 2016-05-04 NOTE — Op Note (Signed)
05/04/2016  10:08 AM  PATIENT:  Dale Holmes, 65 y.o., male, MRN: [id number]  PREOP DIAGNOSIS:  Cholecystitis  POSTOP DIAGNOSIS:   Acute cholecystitis with focal infarction of the gall bladder, cholelithiasis  PROCEDURE:   Procedure(s):  LAPAROSCOPIC CHOLECYSTECTOMY WITH INTRAOPERATIVE CHOLANGIOGRAM  SURGEON:   Alphonsa Overall, M.D.  ASSISTANT:   P. Marlou Starks, M.D.  ANESTHESIA:   general  Anesthesiologist: Roberts Gaudy, MD CRNA: Sharlette Dense, CRNA  General  ASA: 2E  EBL:  100  ml  BLOOD ADMINISTERED: none  DRAINS: 19 F Blake  LOCAL MEDICATIONS USED:   None  [Note:  I injected approx. 28 cc of Isovue 300 in the subcutaneous tissues as a presumed local.]  SPECIMEN:   Gall bladder  COUNTS CORRECT:  YES  INDICATIONS FOR PROCEDURE:  Dale Holmes is a 65 y.o. (DOB: [birth year]) white  male whose primary care physician is Jerlyn Ly, MD and comes for cholecystectomy.   The indications and risks of the gall bladder surgery were explained to the patient.  The risks include, but are not limited to, infection, bleeding, common bile duct injury and open surgery.  SURGERY:  The patient was taken to OR room #[facility] at [facility].  The abdomen was prepped with chloroprep.  The patient was given 2 gm Ancef at the beginning of the operation.   A time out was held and the surgical checklist run.   An infraumbilical incision was made into the abdominal cavity.  A 12 mm Hasson trocar was inserted into the abdominal cavity through the infraumbilical incision and secured with a 0 Vicryl suture.  Three additional trocars were inserted: a 10 mm trocar in the sub-xiphoid location, a 5 mm trocar in the right mid subcostal area, and a 5 mm trocar in the right lateral subcostal area.   The abdomen was explored and the liver, stomach, and bowel that could be seen were unremarkable.   The gall bladder was encased in omentum and was acutely inflamed.  I stipped the omentum away from the gall bladder and  there were several necrotic patches on the gall bladder surface consistent with local infarction.   I grasped the gall bladder and rotated it cephalad.  Disssection was carried down to the gall bladder/cystic duct junction and the cystic duct isolated.  A clip was placed on the gall bladder side of the cystic duct.   An intra-operative cholangiogram was shot.   The intra-operative cholangiogram was shot using a cut off Taut catheter placed through a 14 gauge angiocath in the RUQ.  The Taut catheter was inserted in the cut cystic duct and secured with an endoclip.  A cholangiogram was shot with 8 cc of 1/2 strength Isovue 300.  Using fluoroscopy, the cholangiogram showed the flow of contrast into the common bile duct, up the hepatic radicals, and into the duodenum.  There was no mass or obstruction.  This was a normal intra-operative cholangiogram.   The Taut catheter was removed.  The cystic duct was tripley endoclipped and the cystic artery was identified and clipped.  The gall bladder was bluntly and sharpley dissected from the gall bladder bed.   After the gall bladder was removed from the liver, the gall bladder bed and Triangle of [place] were inspected.  The upper part of the liver was beat up dissecting out the gall bladder.  The gall bladder had evidence of acute inflammation with focal infarction, but also had evidence of chronic disease.  There was no bleeding  or bile leak, but because of the appearance of the gall bladder and the liver, I left a 38 F drain in the gall bladder bed.  This was brought out through the lateral left 5 mm trocar. The gall bladder was placed in a endocatch bag and delivered through the umbilicus.  The abdomen was irrigated with 4,000 cc saline.   The trocars were then removed.  I infiltrated 28 cc of what I thought was 1/4% Marcaine into the incisions, but upon questioning, this was Isoview 300.  I talked to Dr. Domenick Bookbinder about this and he said that usually extravasation of  contrast is well tolerated, but deserves watching.  The umbilical port closed with a 0 Vicryl suture and the skin closed with 4-0 Monocryl.  The skin was painted with LiquidBand.  The patient's sponge and needle count were correct.  The patient was transported to the RR in good condition.  Alphonsa Overall, MD, [facility] Pager: [phone] Office phone:  [phone]

## 2016-05-04 NOTE — ED Notes (Signed)
CHG bath completed  

## 2016-05-04 NOTE — Anesthesia Postprocedure Evaluation (Signed)
Anesthesia Post Note  Patient: Dale Holmes  Procedure(s) Performed: Procedure(s) (LRB): LAPAROSCOPIC CHOLECYSTECTOMY WITH INTRAOPERATIVE CHOLANGIOGRAM (N/A)  Patient location during evaluation: PACU Anesthesia Type: General Level of consciousness: awake, awake and alert and oriented Pain management: pain level controlled Vital Signs Assessment: post-procedure vital signs reviewed and stable Respiratory status: spontaneous breathing, nonlabored ventilation and respiratory function stable Cardiovascular status: blood pressure returned to baseline Anesthetic complications: no    Last Vitals:  Filed Vitals:   05/04/16 1239 05/04/16 1327  BP: 111/62 112/58  Pulse: 66 65  Temp: 36.6 C 36.4 C  Resp: 18 17    Last Pain:  Filed Vitals:   05/04/16 1328  PainSc: 8                  Albert Devaul,Masayoshi COKER

## 2016-05-04 NOTE — Interval H&P Note (Signed)
History and Physical Interval Note:  05/04/2016 7:30 AM  Dale Holmes  has presented today for surgery, with the diagnosis of Cholecystitis  The various methods of treatment have been discussed with the patient and family.   Wife with him.  He feels better today.  After consideration of risks, benefits and other options for treatment, the patient has consented to  Procedure(s): LAPAROSCOPIC CHOLECYSTECTOMY WITH INTRAOPERATIVE CHOLANGIOGRAM (N/A) as a surgical intervention .  The patient's history has been reviewed, patient examined, no change in status, stable for surgery.  I have reviewed the patient's chart and labs.  Questions were answered to the patient's satisfaction.     Akhil Piscopo,Terique H

## 2016-05-04 NOTE — Anesthesia Procedure Notes (Signed)
Procedure Name: Intubation Date/Time: 05/04/2016 8:02 AM Performed by: Danley Danker L Patient Re-evaluated:Patient Re-evaluated prior to inductionOxygen Delivery Method: Circle system utilized Preoxygenation: Pre-oxygenation with 100% oxygen Intubation Type: IV induction Ventilation: Mask ventilation without difficulty Laryngoscope Size: Miller and 3 Grade View: Grade I Tube type: Oral Tube size: 8.0 mm Number of attempts: 1 Airway Equipment and Method: Stylet Placement Confirmation: ETT inserted through vocal cords under direct vision,  positive ETCO2 and breath sounds checked- equal and bilateral Secured at: 21 cm Tube secured with: Tape Dental Injury: Teeth and Oropharynx as per pre-operative assessment

## 2016-05-04 NOTE — Anesthesia Preprocedure Evaluation (Signed)

## 2016-05-05 DIAGNOSIS — Z79899 Other long term (current) drug therapy: Secondary | ICD-10-CM | POA: Diagnosis not present

## 2016-05-05 DIAGNOSIS — K8000 Calculus of gallbladder with acute cholecystitis without obstruction: Secondary | ICD-10-CM | POA: Diagnosis not present

## 2016-05-05 DIAGNOSIS — Z7982 Long term (current) use of aspirin: Secondary | ICD-10-CM | POA: Diagnosis not present

## 2016-05-05 DIAGNOSIS — Z8546 Personal history of malignant neoplasm of prostate: Secondary | ICD-10-CM | POA: Diagnosis not present

## 2016-05-05 LAB — COMPREHENSIVE METABOLIC PANEL
ALT: 37 U/L (ref 17–63)
ANION GAP: 6 (ref 5–15)
AST: 37 U/L (ref 15–41)
Albumin: 3.5 g/dL (ref 3.5–5.0)
Alkaline Phosphatase: 40 U/L (ref 38–126)
BUN: 17 mg/dL (ref 6–20)
CHLORIDE: 105 mmol/L (ref 101–111)
CO2: 26 mmol/L (ref 22–32)
CREATININE: 0.8 mg/dL (ref 0.61–1.24)
Calcium: 8.4 mg/dL — ABNORMAL LOW (ref 8.9–10.3)
Glucose, Bld: 109 mg/dL — ABNORMAL HIGH (ref 65–99)
POTASSIUM: 4 mmol/L (ref 3.5–5.1)
SODIUM: 137 mmol/L (ref 135–145)
Total Bilirubin: 0.5 mg/dL (ref 0.3–1.2)
Total Protein: 6.4 g/dL — ABNORMAL LOW (ref 6.5–8.1)

## 2016-05-05 LAB — CBC
HEMATOCRIT: 38.5 % — AB (ref 39.0–52.0)
Hemoglobin: 13 g/dL (ref 13.0–17.0)
MCH: 30.2 pg (ref 26.0–34.0)
MCHC: 33.8 g/dL (ref 30.0–36.0)
MCV: 89.5 fL (ref 78.0–100.0)
Platelets: 176 10*3/uL (ref 150–400)
RBC: 4.3 MIL/uL (ref 4.22–5.81)
RDW: 13.8 % (ref 11.5–15.5)
WBC: 9.9 10*3/uL (ref 4.0–10.5)

## 2016-05-05 MED ORDER — HYDROCODONE-ACETAMINOPHEN 5-325 MG PO TABS: 1.0000 | ORAL_TABLET | Freq: Four times a day (QID) | ORAL | Status: AC | PRN

## 2016-05-05 NOTE — Discharge Summary (Signed)
Physician Discharge Summary  Patient ID:  Dale Holmes  MRN: [id number]  DOB/AGE: 1950-12-14 65 y.o.  Admit date: 05/04/2016 Discharge date: 05/05/2016  Discharge Diagnoses:  1.  Acute cholecystitis, cholelithiasis 2. Prostate cancer - robotic prostatectomy - 11/09/2015 - Dr. Alinda Money for urology 3. Broke left elbow which required surgery by Dr. Berenice Primas about 3 weeks ago.   Active Problems:   Cholecystitis  Operation: Procedure(s): LAPAROSCOPIC CHOLECYSTECTOMY WITH INTRAOPERATIVE CHOLANGIOGRAM on 05/04/2016 - [facility]  Discharged Condition: good  Hospital Course: Dale Holmes is an 65 y.o. male whose primary care physician is Jerlyn Ly, MD and who was admitted 05/04/2016 with a chief complaint of acute cholecystitis.   He was brought to the operating room on 05/04/2016 and underwent Lap cholecystectomy with IOC.   He is one day post op and doing well.  His WBC is back down.  He is sore, but doing well.  He is tolerating po intake.  He is ready to go home.  The discharge instructions were reviewed with the patient.  Consults: None  Significant Diagnostic Studies: Results for orders placed or performed during the hospital encounter of 05/04/16  CBC  Result Value Ref Range   WBC 9.9 4.0 - 10.5 K/uL   RBC 4.30 4.22 - 5.81 MIL/uL   Hemoglobin 13.0 13.0 - 17.0 g/dL   HCT 38.5 (L) 39.0 - 52.0 %   MCV 89.5 78.0 - 100.0 fL   MCH 30.2 26.0 - 34.0 pg   MCHC 33.8 30.0 - 36.0 g/dL   RDW 13.8 11.5 - 15.5 %   Platelets 176 150 - 400 K/uL  Comprehensive metabolic panel  Result Value Ref Range   Sodium 137 135 - 145 mmol/L   Potassium 4.0 3.5 - 5.1 mmol/L   Chloride 105 101 - 111 mmol/L   CO2 26 22 - 32 mmol/L   Glucose, Bld 109 (H) 65 - 99 mg/dL   BUN 17 6 - 20 mg/dL   Creatinine, Ser 0.80 0.61 - 1.24 mg/dL   Calcium 8.4 (L) 8.9 - 10.3 mg/dL   Total Protein 6.4 (L) 6.5 - 8.1 g/dL   Albumin 3.5 3.5 - 5.0 g/dL   AST 37 15 - 41 U/L   ALT 37 17 - 63 U/L   Alkaline Phosphatase  40 38 - 126 U/L   Total Bilirubin 0.5 0.3 - 1.2 mg/dL   GFR calc non Af Amer >60 >60 mL/min   GFR calc Af Amer >60 >60 mL/min   Anion gap 6 5 - 15    Dg Chest 2 View  05/02/2016  CLINICAL DATA:  Chest pain, epigastric pain.  Worse with movement. EXAM: CHEST  2 VIEW COMPARISON:  CT chest 11/07/2015 FINDINGS: The heart size and mediastinal contours are within normal limits. Both lungs are clear. The visualized skeletal structures are unremarkable. IMPRESSION: No active cardiopulmonary disease. Electronically Signed   By: Kathreen Devoid   On: 05/02/2016 18:19   Dg Elbow Complete Left  04/09/2016  CLINICAL DATA:  [place] onto left elbow last night with pain and swelling EXAM: LEFT ELBOW - COMPLETE 3+ VIEW COMPARISON:  None. FINDINGS: There is an oblique slightly distracted fracture of the proximal left ulna with the fracture extending to the left elbow joint space. A small elbow joint effusion is present. The radial head appears intact and otherwise alignment is normal. IMPRESSION: Oblique slightly distracted fracture of the proximal left ulna. Electronically Signed   By: Ivar Drape M.D.   On: 04/09/2016  10:46   Dg Cholangiogram Operative  05/04/2016  CLINICAL DATA:  Cholecystitis. EXAM: INTRAOPERATIVE CHOLANGIOGRAM TECHNIQUE: Cholangiographic images from the C-arm fluoroscopic device were submitted for interpretation post-operatively. Please see the procedural report for the amount of contrast and the fluoroscopy time utilized. COMPARISON:  Ultrasound 05/02/2016 FINDINGS: a single image from an intraoperative cholangiogram is submitted. Cine run was not saved by accident per technologist notes. No biliary duct dilatation. No contrast extravasation. The given limitation of single image, no definite common duct stone identified. Contrast identified within the duodenum. IMPRESSION: Limited exam, as only a single image was submitted, as above. Given this factor, no evidence of choledocholithiasis. Electronically  Signed   By: Abigail Miyamoto M.D.   On: 05/04/2016 09:14   US Abdomen Complete  05/02/2016  CLINICAL DATA:  65 year old male with epigastric abdominal pain. EXAM: ABDOMEN ULTRASOUND COMPLETE COMPARISON:  None. FINDINGS: Gallbladder: There is stone within the gallbladder. Echogenic material along the gallbladder wall most likely represent gallbladder sludge/sludge ball. There is no gallbladder wall thickening or pericholecystic. Common bile duct: Diameter: 4 mm Liver: No focal lesion identified. Within normal limits in parenchymal echogenicity. IVC: No abnormality visualized. Pancreas: Grossly unremarkable as visualized. Spleen: Size and appearance within normal limits. Right Kidney: Length: 9 cm. Echogenicity within normal limits. No mass or hydronephrosis visualized. Left Kidney: Length: 11 send. Echogenicity within normal limits. No mass or hydronephrosis visualized. Abdominal aorta: No aneurysm visualized. Other findings: None. IMPRESSION: Gallstones and sludge without sonographic evidence acute cholecystitis. Otherwise unremarkable abdominal ultrasound. Electronically Signed   By: Anner Crete M.D.   On: 05/02/2016 20:27    Discharge Exam:  Filed Vitals:   05/05/16 0140 05/05/16 0504  BP: 104/69 110/67  Pulse: 63 65  Temp: 97.7 F (36.5 C) 98.6 F (37 C)  Resp: 16 16    General: WN older WM who is alert and generally healthy appearing.  Lungs: Clear to auscultation and symmetric breath sounds. Heart:  RRR. No murmur or rub. Abdomen: Soft. No hernia. Normal bowel sounds. Drain out RUQ - 145 cc recorded last 24 hours.  Discharge Medications:     Medication List    TAKE these medications        amoxicillin-clavulanate 875-125 MG tablet  Commonly known as:  AUGMENTIN  Take 1 tablet by mouth 2 (two) times daily. 05/03/16-05/09/16     aspirin EC 325 MG tablet  Take 325 mg by mouth daily.     CIALIS 5 MG tablet  Generic drug:  tadalafil  Take 5 mg by mouth every evening.      gabapentin 300 MG capsule  Commonly known as:  NEURONTIN  Take 1 capsule (300 mg total) by mouth 2 (two) times daily.     HYDROcodone-acetaminophen 5-325 MG tablet  Commonly known as:  NORCO/VICODIN  Take 1-2 tablets by mouth every 6 (six) hours as needed. pain     HYDROcodone-acetaminophen 5-325 MG tablet  Commonly known as:  NORCO/VICODIN  Take 1-2 tablets by mouth every 6 (six) hours as needed for moderate pain.     ibuprofen 200 MG tablet  Commonly known as:  ADVIL,MOTRIN  Take 400 mg by mouth 2 (two) times daily as needed for moderate pain.     multivitamin with minerals Tabs tablet  Take 1 tablet by mouth daily.     omeprazole 20 MG capsule  Commonly known as:  PRILOSEC  Take 1 capsule (20 mg total) by mouth daily.     ondansetron 8 MG disintegrating tablet  Commonly known as:  ZOFRAN-ODT  Take 8 mg by mouth every 6 (six) hours as needed. n/v     oxyCODONE-acetaminophen 5-325 MG tablet  Commonly known as:  PERCOCET/ROXICET  Take 1-2 tablets by mouth every 4 (four) hours as needed. pain     rosuvastatin 10 MG tablet  Commonly known as:  CRESTOR  Take 10 mg by mouth daily.        Disposition: 01-Home or Self Care      Discharge Instructions    Diet - low sodium heart healthy    Complete by:  As directed      Increase activity slowly    Complete by:  As directed            Activity:  Driving - May drive in 2 or 3 days, if doing well   Lifting - No lifting more than 15 pounds for 1 week, then no limit  Wound Care:   May shower starting tomorrow.       Empty drain twice a day.  Record the amount.  Diet:  As tolerated  Follow up appointment:  Call Dr. Pollie Friar office [facility]) at [phone] for an appointment in this Wednesday or Thursday for drain removal  Medications and dosages:  Resume your home medications.  You have a prescription for:  Vicodin.  Continue the Augmentin for 3 more days.   Signed: Alphonsa Overall, M.D.,  [facility] Office:  [phone]  05/05/2016, 8:38 AM

## 2016-05-05 NOTE — Progress Notes (Signed)
Patient alert and oriented with VSS and pain controlled. Patient and spouse given discharge instructions and teaching on how to empty JP drain and to change dressings. Patient verbalized understanding of all discharge instructions. All questions and concerns answered.

## 2016-05-05 NOTE — Discharge Instructions (Signed)
CENTRAL  SURGERY - DISCHARGE INSTRUCTIONS TO PATIENT  Activity:  Driving - May drive in 2 or 3 days, if doing well   Lifting - No lifting more than 15 pounds for 1 week, then no limit  Wound Care:   May shower starting tomorrow.       Empty drain twice a day.  Record the amount.  Diet:  As tolerated  Follow up appointment:  Call Dr. Pollie Friar office [facility]) at [phone] for an appointment in this Wednesday or Thursday for drain removal  Medications and dosages:  Resume your home medications.  You have a prescription for:  Vicodin.  Continue the Augmentin for 3 more days.  Call Dr. Lucia Gaskins or his office  [phone]) if you have:  Temperature greater than 100.4,  Persistent nausea and vomiting,  Severe uncontrolled pain,  Redness, tenderness, or signs of infection (pain, swelling, redness, odor or green/yellow discharge around the site),  Difficulty breathing, headache or visual disturbances,  Any other questions or concerns you may have after discharge.  In an emergency, call 911 or go to an Emergency Department at a nearby hospital.

## 2016-05-06 ENCOUNTER — Telehealth: Payer: Self-pay | Admitting: *Deleted

## 2016-05-06 ENCOUNTER — Encounter (HOSPITAL_COMMUNITY): Payer: Self-pay | Admitting: Surgery

## 2016-05-06 NOTE — Telephone Encounter (Signed)
Pt was on TCM list admitted for cholecystitis. She had a laparoscopic cholecystectomy, D/C 05/05/16, and will f.u w/Dr. Lucia Gaskins...Johny Chess

## 2016-05-23 DIAGNOSIS — S52032D Displaced fracture of olecranon process with intraarticular extension of left ulna, subsequent encounter for closed fracture with routine healing: Secondary | ICD-10-CM | POA: Diagnosis not present

## 2016-05-24 DIAGNOSIS — C61 Malignant neoplasm of prostate: Secondary | ICD-10-CM | POA: Diagnosis not present

## 2016-05-31 DIAGNOSIS — N5231 Erectile dysfunction following radical prostatectomy: Secondary | ICD-10-CM | POA: Diagnosis not present

## 2016-05-31 DIAGNOSIS — Z8546 Personal history of malignant neoplasm of prostate: Secondary | ICD-10-CM | POA: Diagnosis not present

## 2016-06-12 ENCOUNTER — Ambulatory Visit: Payer: Medicare Other | Attending: Sports Medicine | Admitting: Physical Therapy

## 2016-06-12 DIAGNOSIS — M545 Low back pain, unspecified: Secondary | ICD-10-CM

## 2016-06-12 DIAGNOSIS — M6281 Muscle weakness (generalized): Secondary | ICD-10-CM | POA: Insufficient documentation

## 2016-06-12 DIAGNOSIS — R2689 Other abnormalities of gait and mobility: Secondary | ICD-10-CM | POA: Diagnosis not present

## 2016-06-12 DIAGNOSIS — M25622 Stiffness of left elbow, not elsewhere classified: Secondary | ICD-10-CM | POA: Insufficient documentation

## 2016-06-13 NOTE — Therapy (Signed)
[place], [place], [address] Phone: [phone]   Fax:  [phone]  Physical Therapy Evaluation  Patient Details  Name: Dale Holmes MRN: [id number] Date of Birth: Jun 07, 1951 Referring Provider: Dr. Berenice Primas  Encounter Date: 06/12/2016      PT End of Session - 06/13/16 0624    Visit Number 1   Number of Visits 16   Date for PT Re-Evaluation 08/07/16   PT Start Time 1330   PT Stop Time 1418   PT Time Calculation (min) 48 min   Activity Tolerance Patient tolerated treatment well   Behavior During Therapy [facility] for tasks assessed/performed      Past Medical History  Diagnosis Date  . Hyperlipidemia 2007    NMR LDL goal = < 100  . Herniated disc, cervical     history of  . Hx of basal cell carcinoma   . Hx of cardiac arrhythmia   . Hx of hypercholesterolemia   . Hx of cardiac murmur   . Dysrhythmia     irregular heart beat   . Prostate cancer ([place]) 08/22/15  . Skin cancer     Past Surgical History  Procedure Laterality Date  . Appendectomy    . Inguinal heriorrhaphy      R  . Colonoscopy  2004    negative; Dr Fuller Plan  . Prostate biopsy  08/22/2015  . Robot assisted laparoscopic radical prostatectomy N/A 11/09/2015    Procedure: ROBOTIC ASSISTED LAPAROSCOPIC RADICAL PROSTATECTOMY LEVEL 2;  Surgeon: Raynelle Bring, MD;  Location: [facility] ORS;  Service: Urology;  Laterality: N/A;  . Lymphadenectomy Bilateral 11/09/2015    Procedure: PELVIC LYMPHADENECTOMY;  Surgeon: Raynelle Bring, MD;  Location: [facility] ORS;  Service: Urology;  Laterality: Bilateral;  . Cholecystectomy N/A 05/04/2016    Procedure: LAPAROSCOPIC CHOLECYSTECTOMY WITH INTRAOPERATIVE CHOLANGIOGRAM;  Surgeon: Alphonsa Overall, MD;  Location: [facility] ORS;  Service: General;  Laterality: N/A;    There were no vitals filed for this visit.       Subjective Assessment - 06/12/16 1342    Subjective L elbow fx in May, underwent ORIF soon after. L elbow seems  to be 85-90% of normal.  He has noticed Rt hip is weaker than before.  He would like to streamline his HEP, return to Pilates.  He went golfing and arm did not bother him., but realizes he may need therapy.    Pertinent History recent prostate surgery, herniated cervical disc 17 yrs ago, dysrhythmia   Limitations Lifting;Standing;Walking   Diagnostic tests MRI 03/04/16 : multilevel degeneration L1-L2-L3-L4 mostly to the L.  There is a possibility chronic weakness could be from his cervical spine, but no further diagnostics have been ordered.    Patient Stated Goals Goal strengthen Rt. LE, prevent surgery, maximize outcome of L UE (dominant)   Currently in Pain? No/denies   Pain Location Elbow   Pain Orientation Left   Pain Descriptors / Indicators Tightness;Discomfort   Pain Type Surgical pain   Pain Onset More than a month ago   Pain Frequency Intermittent            [facility] PT Assessment - 06/13/16 0001    Assessment   Medical Diagnosis L elbow fx with ORIF   Onset Date/Surgical Date --  May 2017   Hand Dominance Left   Prior Therapy Yes but not for elbow    Precautions   Precautions None   Restrictions   Weight Bearing Restrictions No   Home Environment  Living Environment Private residence   Prior Function   Level of Independence Independent   Cognition   Overall Cognitive Status Within Functional Limits for tasks assessed   Sensation   Light Touch Appears Intact   Coordination   Gross Motor Movements are Fluid and Coordinated Not tested   Posture/Postural Control   Posture/Postural Control Postural limitations   Postural Limitations Rounded Shoulders;Forward head;Right pelvic obliquity   Posture Comments Rt. leg appears longer in supine    AROM   Right Elbow Flexion 146   Right Elbow Extension 5   Left Elbow Flexion 146   Left Elbow Extension 14   Strength   Overall Strength --  Rt. 28 kg L 35 kg    Right Elbow Flexion 5/5   Right Elbow Extension 4+/5   Left  Elbow Flexion 5/5   Left Elbow Extension 3+/5   Left Wrist Flexion 5/5   Left Wrist Extension 5/5   Palpation   Palpation comment min tenderness along incisional scar     .     [place] Adult PT Treatment/Exercise - 06/12/16 1344    Self-Care   Other Self-Care Comments  stretching elbow and HEP for strength, POC   verbal and demo review of back, hip, core ex   Elbow Exercises   Elbow Extension Strengthening;Left;Supine;Standing   Bar Weights/Barbell (Elbow Extension) 3 lbs;5 lbs   Other elbow exercises standing wall and countertop pushups for HEP, done x 20 (not difficult on wall)   Other elbow exercises supine elbow stretch with 3 lbs weight                 PT Education - 06/13/16 [id number]    Education provided Yes   Education Details elbow HEP, POC to incorporate back, hip if MD allows, HEP and a schedule for simplicity.    Person(s) Educated Patient   Methods Explanation;Demonstration;Tactile cues;Verbal cues;Handout   Comprehension Verbalized understanding;Returned demonstration;Need further instruction          PT Short Term Goals - 06/13/16 0656    PT SHORT TERM GOAL #1   Title Pt will be I with HEP for initial strength/ROM including L UE    Time 4   Period Weeks   Status Revised   PT SHORT TERM GOAL #2   Title Pt will be able to report less stiffness in AM and with transitions (sit to stand after sitting)   Time 4   Period Weeks   Status On-going   PT SHORT TERM GOAL #3   Title Pt will be able to use and understand good body mechanics and posture for mobility.    Time 4   Status On-going   PT SHORT TERM GOAL #4   Title Pt will demo 4+/5 elbow extension for stability in weightbearing   Time 4   Period Weeks   Status New           PT Long Term Goals - 06/13/16 [id number]    PT LONG TERM GOAL #1   Title Pt will be I with HEP (more advanced) for core strength.    Time 8   Period Weeks   Status On-going   PT LONG TERM GOAL #2   Title Pt will be able to  walk on uneven ground (beach, hike) for 1 hour with min to no pain    Status On-going   PT LONG TERM GOAL #3   Title Pt will increase hip abd and glute med strength to 4/5 for  gait stability   Time 8   Period Weeks   Status On-going   PT LONG TERM GOAL #4   Title Pt will be able to walk down steps with good alignment in knee/hips and min cues.    Time 8   Period Weeks   Status On-going   PT LONG TERM GOAL #5   Title Pt will be able to hold a full plank for 15 sec without elbow pain OR back pain.    Time 8   Status New   PT LONG TERM GOAL #6   Title Pt will return to golfing (18 holes) without elbow pain.    Time 8   Period Weeks   Status New               Plan - 07/07/16 0630    Clinical Impression Statement Patient returns for low complexity eval of L elbow ORIF.  He did not finish PT for his lumbar spine and gait instability.  He would like to continue working towards previous goals. We did not reevaluate his Lumbar spine but would like to do so next visit.  L elbow lacks full AROM and triceps are weak.      Rehab Potential Good   PT Frequency 2x / week   PT Duration 8 weeks   PT Treatment/Interventions ADLs/Self Care Home Management;Ultrasound;Neuromuscular re-education;Passive range of motion;Patient/family education;Gait training;Electrical Stimulation;Functional mobility training;Therapeutic activities;Therapeutic exercise;Moist Heat;Manual techniques;Other (comment);Cryotherapy;Balance training   PT Next Visit Plan re-eval L spine, check elbow HEP and review core from previous episode   PT Home Exercise Plan biceps, triceps ext supine, ext stretching and countertop push ups.    Consulted and Agree with Plan of Care Patient      Patient will benefit from skilled therapeutic intervention in order to improve the following deficits and impairments:  Abnormal gait, Decreased range of motion, Increased fascial restricitons, Pain, Impaired flexibility, Hypomobility,  Decreased mobility, Decreased strength, Postural dysfunction, Impaired UE functional use, Decreased balance, Decreased scar mobility  Visit Diagnosis: Other abnormalities of gait and mobility  Muscle weakness (generalized)  Midline low back pain without sciatica  Stiffness of left elbow, not elsewhere classified      G-Codes - 07-07-2016 0700    Functional Assessment Tool Used clinical judgement    Other PT Primary Current Status [id number]) At least 1 percent but less than 20 percent impaired, limited or restricted   Other PT Primary Goal Status [id number]) 0 percent impaired, limited or restricted   Other PT Primary Discharge Status [id number]) At least 1 percent but less than 20 percent impaired, limited or restricted       Problem List Patient Active Problem List   Diagnosis Date Noted  . Cholecystitis 05/04/2016  . Prostate cancer ([place]) 11/09/2015  . Spinal stenosis of lumbar region 10/11/2015  . Weakness of right lower extremity 09/13/2015  . Malignant neoplasm of prostate ([place]) 09/06/2015  . 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 Lumbar radiculopathy, chronic 06/22/2015  . Abnormal prostate by palpation 02/24/2015  . Nonspecific ST-T changes 02/16/2013  . Family history of prostate cancer 02/16/2013  . Hyperlipidemia 09/30/2011  . Basal cell cancer 09/30/2011    Elsy Chiang 07/07/2016, 8:10 AM  [place], [place], [address] Phone: [phone]   Fax:  [phone]  Name: Dale Holmes MRN: [id number] Date of Birth: 02/23/1951   Raeford Razor, PT 2016-07-07 8:10 AM Phone: [phone] Fax: [phone]

## 2016-06-20 IMAGING — CR DG CHEST 2V
3 series · 3 of 3 positions shown · non-contrast
Comparison: None in PACs

CLINICAL DATA: Preoperative examination prior robotic assisted
prostatectomy, nonsmoker, no chest complaints.

EXAM:
CHEST  2 VIEW

[w chest pa]
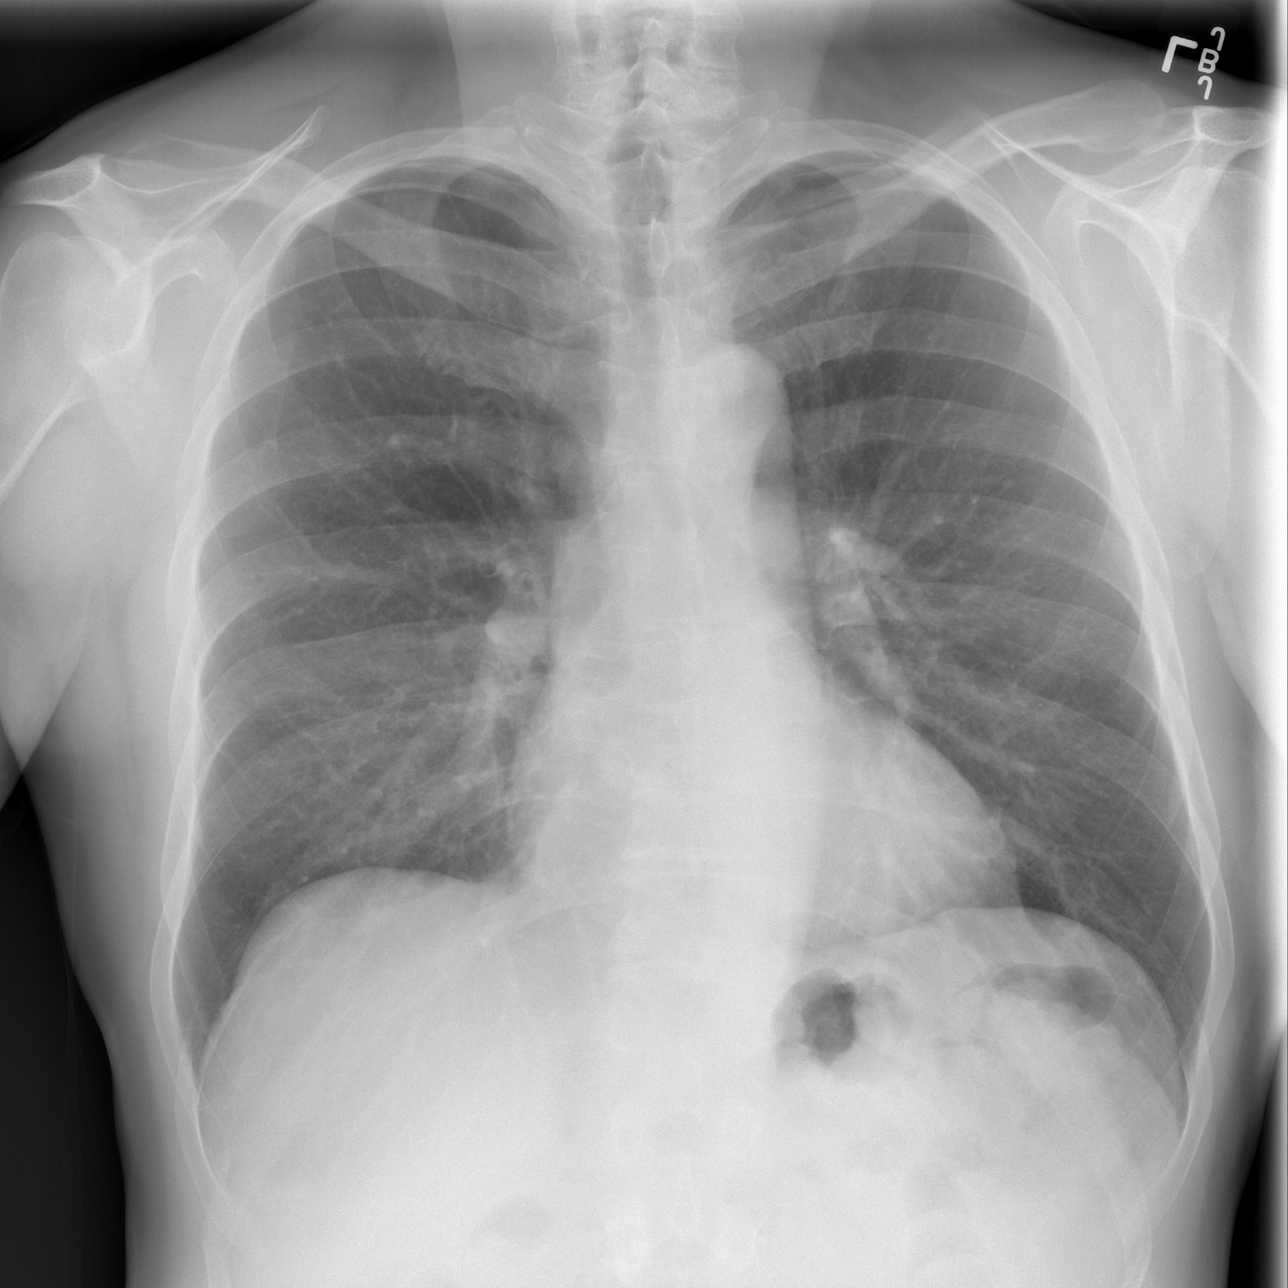

[w chest lat (1 of 2)]
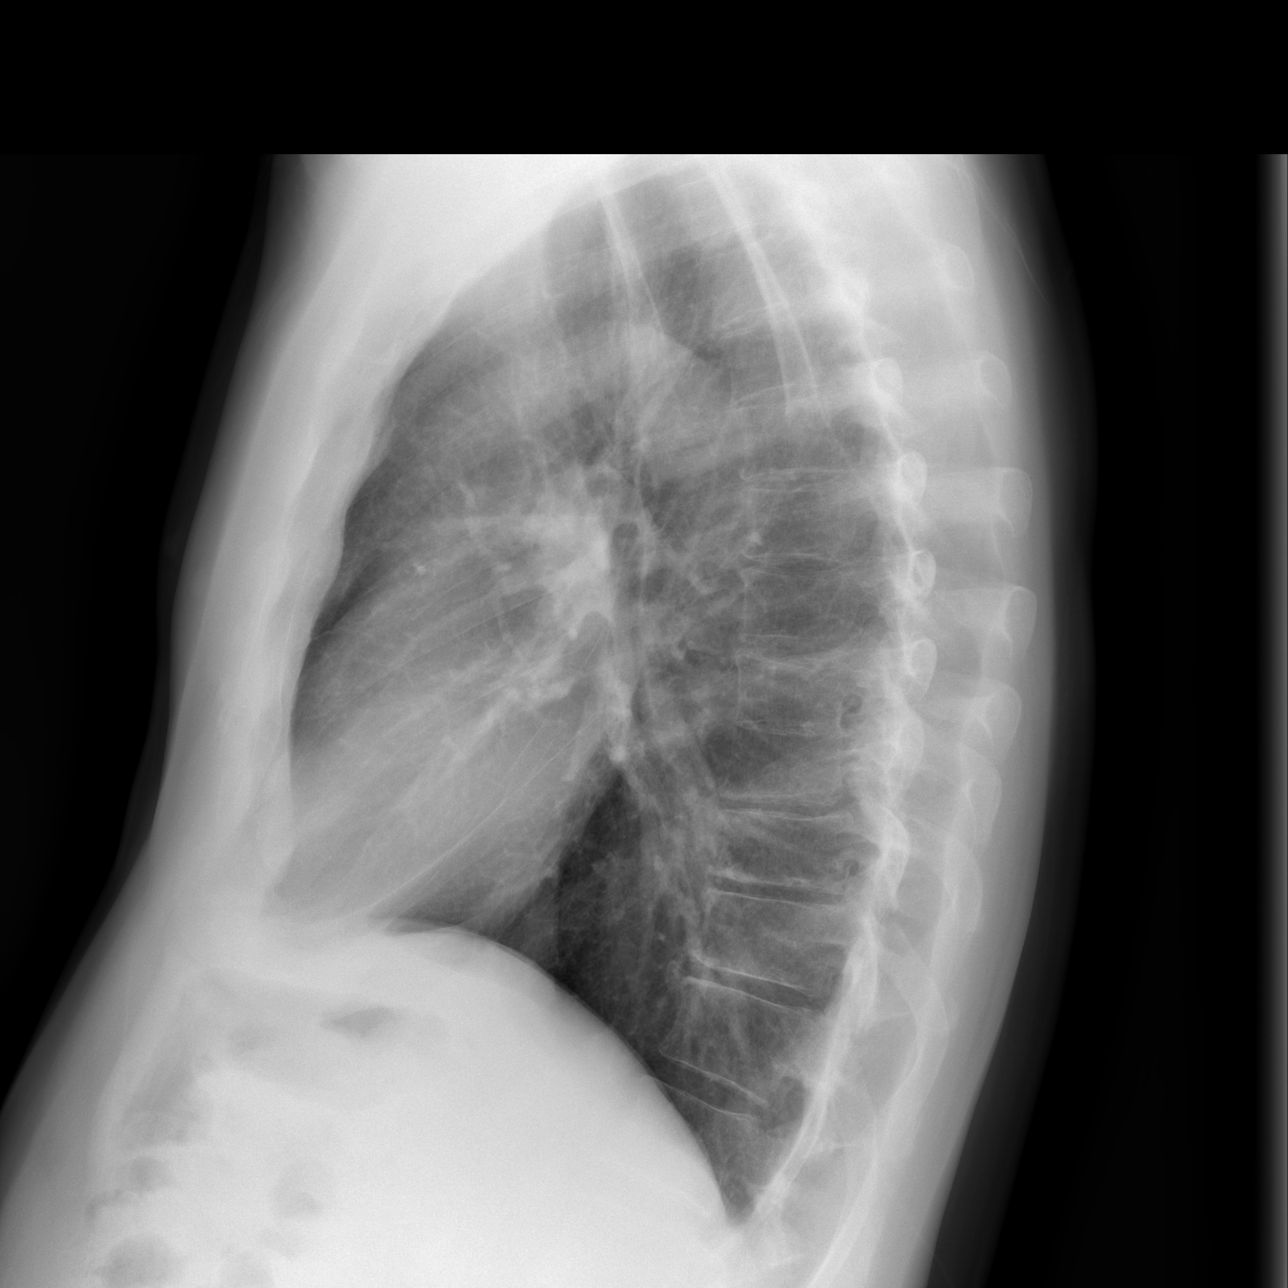

[w chest lat (2 of 2)]
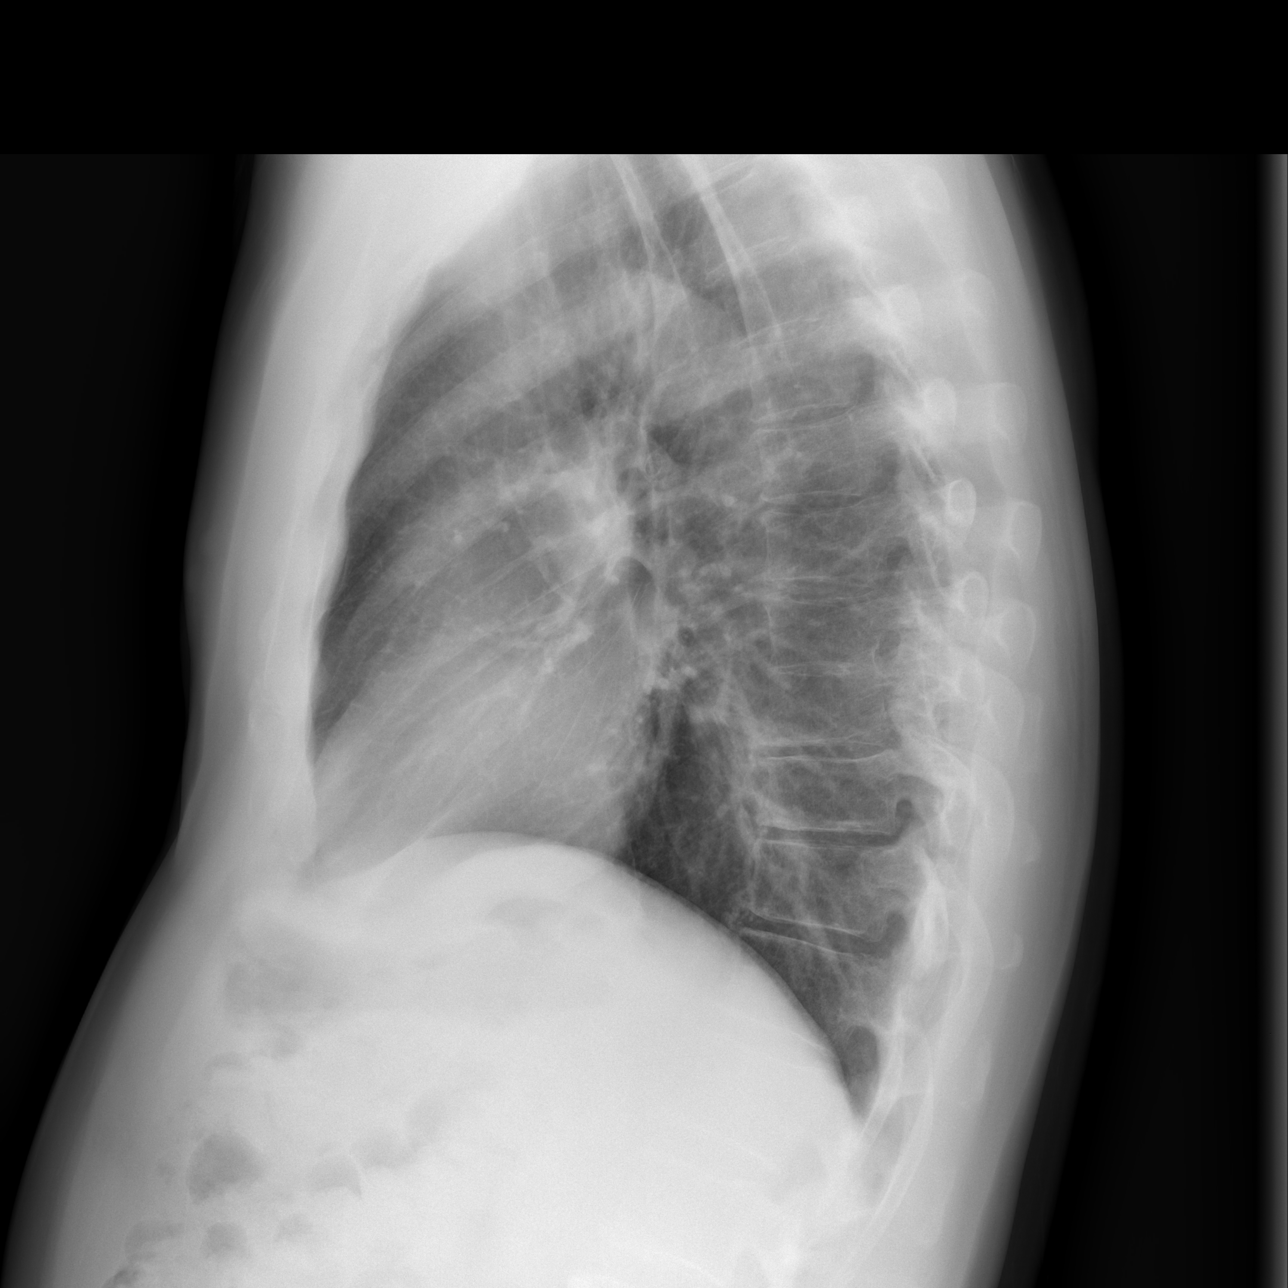

[3 of 3 positions shown; findings below may reference images not displayed]

FINDINGS: The lungs are adequately inflated and clear. The heart and pulmonary
vascularity are normal. The mediastinum is normal in width. There is
no pleural effusion. There is subtle soft tissue density that
projects posterior to T8 and T9 visible on the lateral view only.
Without previous studies it is not possible to judge whether this is
new or old. There is gentle levocurvature of the thoracic spine
centered at approximately T10. There is mild multilevel degenerative
disc disease.
IMPRESSION: 1. There is no evidence of pneumonia nor CHF.
2. Pleural-based soft tissue density in the posterior aspect of the
thorax visible on the lateral image only. Further evaluation with
chest CT scanning is recommended.
3. Mild chronic degenerative changes of the thoracic spine.
4. These results will be called to the ordering clinician or
representative by the Radiologist Assistant, and communication
documented in the PACS or zVision Dashboard.

## 2016-06-26 ENCOUNTER — Ambulatory Visit: Payer: Medicare Other | Attending: Sports Medicine | Admitting: Physical Therapy

## 2016-06-26 DIAGNOSIS — M6281 Muscle weakness (generalized): Secondary | ICD-10-CM | POA: Insufficient documentation

## 2016-06-26 DIAGNOSIS — M25622 Stiffness of left elbow, not elsewhere classified: Secondary | ICD-10-CM | POA: Diagnosis not present

## 2016-06-26 DIAGNOSIS — R2689 Other abnormalities of gait and mobility: Secondary | ICD-10-CM | POA: Insufficient documentation

## 2016-06-26 DIAGNOSIS — M545 Low back pain, unspecified: Secondary | ICD-10-CM

## 2016-06-26 NOTE — Therapy (Signed)
[place], [place], [address] Phone: [phone]   Fax:  [phone]  Physical Therapy Treatment  Patient Details  Name: Dale Holmes MRN: [id number] Date of Birth: August 04, 1951 Referring Provider: Dr. Berenice Primas  Encounter Date: 06/26/2016      PT End of Session - 06/26/16 1159    Visit Number 2   Number of Visits 16   Date for PT Re-Evaluation 08/07/16   PT Start Time 1100   PT Stop Time 1146   PT Time Calculation (min) 46 min   Activity Tolerance Patient tolerated treatment well   Behavior During Therapy [facility] for tasks assessed/performed      Past Medical History:  Diagnosis Date  . Dysrhythmia    irregular heart beat   . Herniated disc, cervical    history of  . Hx of basal cell carcinoma   . Hx of cardiac arrhythmia   . Hx of cardiac murmur   . Hx of hypercholesterolemia   . Hyperlipidemia 2007   NMR LDL goal = < 100  . Prostate cancer ([place]) 08/22/15  . Skin cancer     Past Surgical History:  Procedure Laterality Date  . APPENDECTOMY    . CHOLECYSTECTOMY N/A 05/04/2016   Procedure: LAPAROSCOPIC CHOLECYSTECTOMY WITH INTRAOPERATIVE CHOLANGIOGRAM;  Surgeon: Alphonsa Overall, MD;  Location: [facility];  Service: General;  Laterality: N/A;  . COLONOSCOPY  2004   negative; Dr Fuller Plan  . inguinal heriorrhaphy     R  . LYMPHADENECTOMY Bilateral 11/09/2015   Procedure: PELVIC LYMPHADENECTOMY;  Surgeon: Raynelle Bring, MD;  Location: [facility];  Service: Urology;  Laterality: Bilateral;  . PROSTATE BIOPSY  08/22/2015  . ROBOT ASSISTED LAPAROSCOPIC RADICAL PROSTATECTOMY N/A 11/09/2015   Procedure: ROBOTIC ASSISTED LAPAROSCOPIC RADICAL PROSTATECTOMY LEVEL 2;  Surgeon: Raynelle Bring, MD;  [facility];  Service: Urology;  Laterality: N/A;    There were no vitals filed for this visit.      Subjective Assessment - 06/26/16 1106    Subjective No elbow pain.  Point tenderness in Rt. hip at times.  L heel pain in  AM, self diagnosed as plantar fasciitis.  Noticed more leg weakness lately.     Pertinent History recent prostate surgery, herniated cervical disc 17 yrs ago, dysrhythmia   Limitations Lifting;Standing;Walking   Diagnostic tests MRI 03/04/16 : multilevel degeneration L1-L2-L3-L4 mostly to the L.  There is a possibility chronic weakness could be from his cervical spine, but no further diagnostics have been ordered.    Patient Stated Goals Goal strengthen Rt. LE, prevent surgery, maximize outcome of L UE (dominant)   Currently in Pain? No/denies            [facility] PT Assessment - 06/26/16 1108      Posture/Postural Control   Posture/Postural Control Postural limitations   Postural Limitations Rounded Shoulders;Forward head;Right pelvic obliquity  high L shoulder and high L hip   Posture Comments Rt. leg appears longer in supine      AROM   Lumbar Flexion mid thigh limited by hamstring tightness   Lumbar Extension 25%   Lumbar - Right Side Bend 25%   Lumbar - Left Side Bend 25%   Lumbar - Right Rotation WFL   Lumbar - Left Rotation [facility]      Strength   Right Hip Flexion 4/5   Right Hip Extension 3+/5  glute 3/5   Right Hip ABduction 3+/5  Rt. glute med 3-/5    Left Hip  Flexion 5/5   Left Hip Extension 4/5  glute 4+/5    Right Knee Flexion 4/5   Right Knee Extension 4+/5   Left Knee Flexion 5/5   Left Knee Extension 5/5   Right Ankle Dorsiflexion 5/5   Left Ankle Dorsiflexion 5/5     Flexibility   Hamstrings 65-70 deg bilat.      Palpation   Palpation comment min pain Rt. lateral hip, tender     Trendelenburg Test   Findings Positive   Side Right;Left   Comments Rt weaker than L but evident on each leg in SLS                     [facility] Adult PT Treatment/Exercise - 06/26/16 1123      Elbow Exercises   Elbow Extension Strengthening;Left;Supine;Standing   Bar Weights/Barbell (Elbow Extension) 5 lbs   Other elbow exercises countertop pushups on foam       Lumbar Exercises: Standing   Row Strengthening;Both;20 reps;Theraband;Other (comment)   Row Limitations on foam    Shoulder Extension Strengthening;Both;20 reps;Theraband   Theraband Level (Shoulder Extension) Level 3 (Green)   Shoulder Extension Limitations on foam for balance      Lumbar Exercises: Sidelying   Hip Abduction 20 reps     Knee/Hip Exercises: Stretches   Gastroc Stretch Left;2 reps;30 seconds   Soleus Stretch Left;2 reps;30 seconds     Knee/Hip Exercises: Prone   Hip Extension Strengthening;Both;1 set;10 reps                PT Education - 06/26/16 1159    Education provided Yes   Education Details SLS and glute meds weakness, how to address, use of mirrors for feedback    Person(s) Educated Patient   Methods Explanation;Demonstration;Tactile cues;Verbal cues   Comprehension Need further instruction;Verbal cues required          PT Short Term Goals - 06/26/16 1241      PT SHORT TERM GOAL #1   Title Pt will be I with HEP for initial strength/ROM including L UE      PT SHORT TERM GOAL #2   Title Pt will be able to report less stiffness in AM and with transitions (sit to stand after sitting)   Status On-going     PT SHORT TERM GOAL #3   Title Pt will be able to use and understand good body mechanics and posture for mobility.    Status On-going     PT SHORT TERM GOAL #4   Title Pt will demo 4+/5 elbow extension for stability in weightbearing   Status On-going           PT Long Term Goals - 06/26/16 1241      PT LONG TERM GOAL #1   Title Pt will be I with HEP (more advanced) for core strength.    Status On-going     PT LONG TERM GOAL #2   Title Pt will be able to walk on uneven ground (beach, hike) for 1 hour with min to no pain    Status On-going     PT LONG TERM GOAL #3   Title Pt will increase hip abd and glute med strength to 4/5 for gait stability   Status On-going     PT LONG TERM GOAL #4   Title Pt will be able to walk down  steps with good alignment in knee/hips and min cues.    Status On-going  PT LONG TERM GOAL #5   Title Pt will be able to hold a full plank for 15 sec without elbow pain OR back pain.    Status On-going     PT LONG TERM GOAL #6   Title Pt will return to golfing (18 holes) without elbow pain.    Status On-going               Plan - 06/26/16 1146    Clinical Impression Statement Evaluated back/hip pain and gait issues today. Signifcant weakness in core and hips, due to stenosis? He cont to demo Trendelenburg gait on Rt. side, needs max cues and assist to activate Rt. glute med in standing.  Used visual feedback as well.  Has full HEP established.  Feels difficulty walkijg    Rehab Potential Good   PT Frequency 2x / week   PT Duration 8 weeks   PT Treatment/Interventions ADLs/Self Care Home Management;Ultrasound;Neuromuscular re-education;Passive range of motion;Patient/family education;Gait training;Electrical Stimulation;Functional mobility training;Therapeutic activities;Therapeutic exercise;Moist Heat;Manual techniques;Other (comment);Cryotherapy;Balance training   PT Next Visit Plan elbow and core/hip: use Reformer for footwork, single leg and supine arms, pulling straps    PT Home Exercise Plan biceps, triceps ext supine, ext stretching and countertop push ups.  Previous hip abd   Consulted and Agree with Plan of Care Patient      Patient will benefit from skilled therapeutic intervention in order to improve the following deficits and impairments:  Abnormal gait, Decreased range of motion, Increased fascial restricitons, Pain, Impaired flexibility, Hypomobility, Decreased mobility, Decreased strength, Postural dysfunction, Impaired UE functional use, Decreased balance, Decreased scar mobility  Visit Diagnosis: Other abnormalities of gait and mobility  Muscle weakness (generalized)  Midline low back pain without sciatica  Stiffness of left elbow, not elsewhere  classified     Problem List Patient Active Problem List   Diagnosis Date Noted  . Cholecystitis 05/04/2016  . Prostate cancer ([place]) 11/09/2015  . Spinal stenosis of lumbar region 10/11/2015  . Weakness of right lower extremity 09/13/2015  . Malignant neoplasm of prostate ([place]) 09/06/2015  . 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 Lumbar radiculopathy, chronic 06/22/2015  . Abnormal prostate by palpation 02/24/2015  . Nonspecific ST-T changes 02/16/2013  . Family history of prostate cancer 02/16/2013  . Hyperlipidemia 09/30/2011  . Basal cell cancer 09/30/2011    PAA,JENNIFER 06/26/2016, 12:42 PM  [facility] [address] [place], [place], [address] Phone: [phone]   Fax:  [phone]  Name: MCCAIN BUCHBERGER MRN: [id number] Date of Birth: 06/26/1951   Raeford Razor, PT 06/26/16 12:42 PM Phone: [phone] Fax: [phone]

## 2016-07-01 ENCOUNTER — Other Ambulatory Visit: Payer: Self-pay | Admitting: Sports Medicine

## 2016-07-03 ENCOUNTER — Ambulatory Visit: Payer: Medicare Other | Admitting: Physical Therapy

## 2016-07-03 DIAGNOSIS — R2689 Other abnormalities of gait and mobility: Secondary | ICD-10-CM

## 2016-07-03 DIAGNOSIS — M6281 Muscle weakness (generalized): Secondary | ICD-10-CM | POA: Diagnosis not present

## 2016-07-03 DIAGNOSIS — M545 Low back pain, unspecified: Secondary | ICD-10-CM

## 2016-07-03 DIAGNOSIS — M25622 Stiffness of left elbow, not elsewhere classified: Secondary | ICD-10-CM

## 2016-07-03 NOTE — Therapy (Signed)
[place], [place], [address] Phone: [phone]   Fax:  [phone]  Physical Therapy Treatment  Patient Details  Name: Dale Holmes MRN: [id number] Date of Birth: 05-13-1951 Referring Provider: Dr. Berenice Primas  Encounter Date: 07/03/2016      PT End of Session - 07/03/16 0949    Visit Number 3   Number of Visits 16   Date for PT Re-Evaluation 08/07/16   PT Start Time 0846   PT Stop Time 0943   PT Time Calculation (min) 57 min   Activity Tolerance Patient tolerated treatment well   Behavior During Therapy [facility] for tasks assessed/performed      Past Medical History:  Diagnosis Date  . Dysrhythmia    irregular heart beat   . Herniated disc, cervical    history of  . Hx of basal cell carcinoma   . Hx of cardiac arrhythmia   . Hx of cardiac murmur   . Hx of hypercholesterolemia   . Hyperlipidemia 2007   NMR LDL goal = < 100  . Prostate cancer ([place]) 08/22/15  . Skin cancer     Past Surgical History:  Procedure Laterality Date  . APPENDECTOMY    . CHOLECYSTECTOMY N/A 05/04/2016   Procedure: LAPAROSCOPIC CHOLECYSTECTOMY WITH INTRAOPERATIVE CHOLANGIOGRAM;  Surgeon: Alphonsa Overall, MD;  [facility];  Service: General;  Laterality: N/A;  . COLONOSCOPY  2004   negative; Dr Fuller Plan  . inguinal heriorrhaphy     R  . LYMPHADENECTOMY Bilateral 11/09/2015   Procedure: PELVIC LYMPHADENECTOMY;  Surgeon: Raynelle Bring, MD;  [facility];  Service: Urology;  Laterality: Bilateral;  . PROSTATE BIOPSY  08/22/2015  . ROBOT ASSISTED LAPAROSCOPIC RADICAL PROSTATECTOMY N/A 11/09/2015   Procedure: ROBOTIC ASSISTED LAPAROSCOPIC RADICAL PROSTATECTOMY LEVEL 2;  Surgeon: Raynelle Bring, MD;  [facility];  Service: Urology;  Laterality: N/A;    There were no vitals filed for this visit.      Subjective Assessment - 07/03/16 0851    Subjective I am disappointed I can't walk like I used to.  Was 6/10 uphill,  golfing 10 holes.  No pain now. Won't be able to keep MD appt. tomorrow.          Pilates Reformer used for LE/core strength, postural strength, lumbopelvic disassociation and core control.  Exercises included:  Footwork 2 Red 1 green: heels, forefoot. Worked on alignment and calf/heel raises for plantar fascia pain.  "running" for dynamic stretching  Pt tends to lock Rt. Knee Single leg 1 Red 1 Green   Bridging  2 Red 1 Green x 10 , single leg x 5 each , cues to pull heel closer to bottom vs push   Supine Arm work 1 Blue 1 [employer] Arcs x 10  T Arcs x 10 pt has difficulty maintaining legs in tabletop, get confused easily.         [facility] Adult PT Treatment/Exercise - 07/03/16 0952      Manual Therapy   Manual Therapy Soft tissue mobilization;Myofascial release   Soft tissue mobilization Rt. glute medius , lateral hip    Myofascial Release Rt. hip/trunk                 PT Education - 07/03/16 0951    Education provided Yes   Education Details therapeutic massage and resources for local therapists   Person(s) Educated Patient   Methods Explanation;Demonstration   Comprehension Verbalized understanding;Returned demonstration  PT Short Term Goals - 07/03/16 0956      PT SHORT TERM GOAL #1   Title Pt will be I with HEP for initial strength/ROM including L UE    Status Achieved     PT SHORT TERM GOAL #2   Title Pt will be able to report less stiffness in AM and with transitions (sit to stand after sitting)   Status On-going     PT SHORT TERM GOAL #3   Title Pt will be able to use and understand good body mechanics and posture for mobility.    Status Partially Met     PT SHORT TERM GOAL #4   Title Pt will demo 4+/5 elbow extension for stability in weightbearing   Status Unable to assess           PT Long Term Goals - 07/03/16 0955      PT LONG TERM GOAL #1   Title Pt will be I with HEP (more advanced) for core strength.    Status On-going     PT  LONG TERM GOAL #2   Title Pt will be able to walk on uneven ground (beach, hike) for 1 hour with min to no pain    Status On-going     PT LONG TERM GOAL #3   Title Pt will increase hip abd and glute med strength to 4/5 for gait stability   Status Unable to assess     PT LONG TERM GOAL #4   Title Pt will be able to walk down steps with good alignment in knee/hips and min cues.    Status On-going     PT LONG TERM GOAL #5   Title Pt will be able to hold a full plank for 15 sec without elbow pain OR back pain.    Status On-going     PT LONG TERM GOAL #6   Title Pt will return to golfing (18 holes) without elbow pain.    Baseline 10 holes no incr elbow pain    Status Partially Met               Plan - 07/03/16 0953    Clinical Impression Statement Worked on quad control and hip/core strength on Reformer without increasing pain.  Addressed soft tissue restriction and pain, patient responded well and may pursue regular massage as an adjunct to his PT.  Pain in Rt. hip with uphill walking likley due to abnormal gait mechanics and strain on Rt. lumbar/hip joint.    PT Next Visit Plan elbow and core/hip: use Reformer for footwork, single leg and supine arms, pulling straps , repeat manual    PT Home Exercise Plan biceps, triceps ext supine, ext stretching and countertop push ups.  Previous hip abd   Consulted and Agree with Plan of Care Patient      Patient will benefit from skilled therapeutic intervention in order to improve the following deficits and impairments:  Abnormal gait, Decreased range of motion, Increased fascial restricitons, Pain, Impaired flexibility, Hypomobility, Decreased mobility, Decreased strength, Postural dysfunction, Impaired UE functional use, Decreased balance, Decreased scar mobility  Visit Diagnosis: Other abnormalities of gait and mobility  Muscle weakness (generalized)  Midline low back pain without sciatica  Stiffness of left elbow, not elsewhere  classified     Problem List Patient Active Problem List   Diagnosis Date Noted  . Cholecystitis 05/04/2016  . Prostate cancer ([place]) 11/09/2015  . Spinal stenosis of lumbar region 10/11/2015  . Weakness  of right lower extremity 09/13/2015  . Malignant neoplasm of prostate ([place]) 09/06/2015  . 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 Lumbar radiculopathy, chronic 06/22/2015  . Abnormal prostate by palpation 02/24/2015  . Nonspecific ST-T changes 02/16/2013  . Family history of prostate cancer 02/16/2013  . Hyperlipidemia 09/30/2011  . Basal cell cancer 09/30/2011    Saul Dorsi 07/03/2016, 9:57 AM  [place], [place], [address] Phone: [phone]   Fax:  [phone]  Name: Dale Holmes MRN: [id number] Date of Birth: 1951-06-09   Raeford Razor, PT 07/03/16 9:57 AM Phone: [phone] Fax: [phone]

## 2016-07-10 ENCOUNTER — Ambulatory Visit: Payer: Medicare Other | Admitting: Physical Therapy

## 2016-07-10 DIAGNOSIS — M545 Low back pain, unspecified: Secondary | ICD-10-CM

## 2016-07-10 DIAGNOSIS — M6281 Muscle weakness (generalized): Secondary | ICD-10-CM | POA: Diagnosis not present

## 2016-07-10 DIAGNOSIS — R2689 Other abnormalities of gait and mobility: Secondary | ICD-10-CM | POA: Diagnosis not present

## 2016-07-10 DIAGNOSIS — M25622 Stiffness of left elbow, not elsewhere classified: Secondary | ICD-10-CM | POA: Diagnosis not present

## 2016-07-10 NOTE — Therapy (Signed)
[place], [place], [address] Phone: [phone]   Fax:  [phone]  Physical Therapy Treatment  Patient Details  Name: Dale Holmes MRN: [id number] Date of Birth: [birth year] Referring Provider: Dr. Berenice Primas  Encounter Date: 07/10/2016      PT End of Session - 07/10/16 0901    Visit Number 4   Number of Visits 16   Date for PT Re-Evaluation 08/07/16   PT Start Time 0845   PT Stop Time 0935   PT Time Calculation (min) 50 min   Activity Tolerance Patient tolerated treatment well   Behavior During Therapy [facility] for tasks assessed/performed      Past Medical History:  Diagnosis Date  . Dysrhythmia    irregular heart beat   . Herniated disc, cervical    history of  . Hx of basal cell carcinoma   . Hx of cardiac arrhythmia   . Hx of cardiac murmur   . Hx of hypercholesterolemia   . Hyperlipidemia 2007   NMR LDL goal = < 100  . Prostate cancer ([place]) 08/22/15  . Skin cancer     Past Surgical History:  Procedure Laterality Date  . APPENDECTOMY    . CHOLECYSTECTOMY N/A 05/04/2016   Procedure: LAPAROSCOPIC CHOLECYSTECTOMY WITH INTRAOPERATIVE CHOLANGIOGRAM;  Surgeon: Alphonsa Overall, MD;  [facility];  Service: General;  Laterality: N/A;  . COLONOSCOPY  2004   negative; Dr Fuller Plan  . inguinal heriorrhaphy     R  . LYMPHADENECTOMY Bilateral 11/09/2015   Procedure: PELVIC LYMPHADENECTOMY;  Surgeon: Raynelle Bring, MD;  [facility];  Service: Urology;  Laterality: Bilateral;  . PROSTATE BIOPSY  08/22/2015  . ROBOT ASSISTED LAPAROSCOPIC RADICAL PROSTATECTOMY N/A 11/09/2015   Procedure: ROBOTIC ASSISTED LAPAROSCOPIC RADICAL PROSTATECTOMY LEVEL 2;  Surgeon: Raynelle Bring, MD;  [facility];  Service: Urology;  Laterality: N/A;    There were no vitals filed for this visit.      Subjective Assessment - 07/10/16 0846    Subjective My biggest problem is my L foot (plantar fasciitis).  I know my elbow  is "there" but it doesn't hurt.  Its a non issue.     Currently in Pain? No/denies                         [facility] Adult PT Treatment/Exercise - 07/10/16 0855      Self-Care   Other Self-Care Comments  plantar fascia anatomy and simple stretches, wear good arch support      Lumbar Exercises: Supine   Bridge 20 reps   Straight Leg Raise 10 reps   Straight Leg Raises Limitations from ball    Other Supine Lumbar Exercises table top ex with Pilates Spring board : Arcs and lSLR      Lumbar Exercises: Sidelying   Hip Abduction 20 reps   Other Sidelying Lumbar Exercises sidekick series: circles and Flex/ext      Knee/Hip Exercises: Stretches   Active Hamstring Stretch 2 reps;60 seconds   Piriformis Stretch Both;1 rep;30 seconds   Other Knee/Hip Stretches ITB with sheet x 2 x 60 sec                 PT Education - 07/10/16 0904    Education provided Yes   Education Details plantar fasciitis and Rolfing   Person(s) Educated Patient   Methods Explanation;Demonstration;Tactile cues;Verbal cues   Comprehension Verbalized understanding;Returned demonstration  PT Short Term Goals - 07/03/16 0956      PT SHORT TERM GOAL #1   Title Pt will be I with HEP for initial strength/ROM including L UE    Status Achieved     PT SHORT TERM GOAL #2   Title Pt will be able to report less stiffness in AM and with transitions (sit to stand after sitting)   Status On-going     PT SHORT TERM GOAL #3   Title Pt will be able to use and understand good body mechanics and posture for mobility.    Status Partially Met     PT SHORT TERM GOAL #4   Title Pt will demo 4+/5 elbow extension for stability in weightbearing   Status Unable to assess           PT Long Term Goals - 07/03/16 0955      PT LONG TERM GOAL #1   Title Pt will be I with HEP (more advanced) for core strength.    Status On-going     PT LONG TERM GOAL #2   Title Pt will be able to walk on uneven  ground (beach, hike) for 1 hour with min to no pain    Status On-going     PT LONG TERM GOAL #3   Title Pt will increase hip abd and glute med strength to 4/5 for gait stability   Status Unable to assess     PT LONG TERM GOAL #4   Title Pt will be able to walk down steps with good alignment in knee/hips and min cues.    Status On-going     PT LONG TERM GOAL #5   Title Pt will be able to hold a full plank for 15 sec without elbow pain OR back pain.    Status On-going     PT LONG TERM GOAL #6   Title Pt will return to golfing (18 holes) without elbow pain.    Baseline 10 holes no incr elbow pain    Status Partially Met               Plan - 07/10/16 0919    Clinical Impression Statement Patient cont to have gait instability due to glute weakness.  He is considering Rolfing to correct his posture, although his issues are related to weakness, not tightness.  He is progressing in strength but cont to need mod cueing for technique and form.    PT Next Visit Plan elbow and core/hip: use Reformer for footwork, single leg and supine arms, pulling straps , repeat manual if needed   PT Home Exercise Plan biceps, triceps ext supine, ext stretching and countertop push ups.  Previous hip abd and gave step stretch for plantar fasciitis   Consulted and Agree with Plan of Care Patient      Patient will benefit from skilled therapeutic intervention in order to improve the following deficits and impairments:  Abnormal gait, Decreased range of motion, Increased fascial restricitons, Pain, Impaired flexibility, Hypomobility, Decreased mobility, Decreased strength, Postural dysfunction, Impaired UE functional use, Decreased balance, Decreased scar mobility  Visit Diagnosis: Other abnormalities of gait and mobility  Muscle weakness (generalized)  Midline low back pain without sciatica  Stiffness of left elbow, not elsewhere classified     Problem List Patient Active Problem List    Diagnosis Date Noted  . Cholecystitis 05/04/2016  . Prostate cancer ([place]) 11/09/2015  . Spinal stenosis of lumbar region 10/11/2015  . Weakness of  right lower extremity 09/13/2015  . Malignant neoplasm of prostate ([place]) 09/06/2015  . 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 Lumbar radiculopathy, chronic 06/22/2015  . Abnormal prostate by palpation 02/24/2015  . Nonspecific ST-T changes 02/16/2013  . Family history of prostate cancer 02/16/2013  . Hyperlipidemia 09/30/2011  . Basal cell cancer 09/30/2011    Aldine Grainger 07/10/2016, 9:46 AM  [facility] [address] [place], [place], [address] Phone: [phone]   Fax:  [phone]  Name: Dale Holmes MRN: [id number] Date of Birth: [birth year]   Raeford Razor, PT 07/10/16 9:46 AM Phone: [phone] Fax: [phone]

## 2016-07-11 ENCOUNTER — Encounter (HOSPITAL_COMMUNITY): Payer: Self-pay

## 2016-07-17 ENCOUNTER — Ambulatory Visit: Payer: Medicare Other | Admitting: Physical Therapy

## 2016-07-17 DIAGNOSIS — M6281 Muscle weakness (generalized): Secondary | ICD-10-CM | POA: Diagnosis not present

## 2016-07-17 DIAGNOSIS — M25622 Stiffness of left elbow, not elsewhere classified: Secondary | ICD-10-CM | POA: Diagnosis not present

## 2016-07-17 DIAGNOSIS — M545 Low back pain, unspecified: Secondary | ICD-10-CM

## 2016-07-17 DIAGNOSIS — R2689 Other abnormalities of gait and mobility: Secondary | ICD-10-CM

## 2016-07-17 NOTE — Therapy (Signed)
[place], [place], [address] Phone: [phone]   Fax:  [phone]  Physical Therapy Treatment  Patient Details  Name: Dale Holmes MRN: [id number] Date of Birth: 02/27/1951 Referring Provider: Dr. Berenice Primas  Encounter Date: 07/17/2016      PT End of Session - 07/17/16 0907    Visit Number 5   Number of Visits 16   Date for PT Re-Evaluation 08/07/16   PT Start Time 0845   PT Stop Time 0936   PT Time Calculation (min) 51 min   Activity Tolerance Patient tolerated treatment well   Behavior During Therapy [facility] for tasks assessed/performed      Past Medical History:  Diagnosis Date  . Dysrhythmia    irregular heart beat   . Herniated disc, cervical    history of  . Hx of basal cell carcinoma   . Hx of cardiac arrhythmia   . Hx of cardiac murmur   . Hx of hypercholesterolemia   . Hyperlipidemia 2007   NMR LDL goal = < 100  . Prostate cancer ([place]) 08/22/15  . Skin cancer     Past Surgical History:  Procedure Laterality Date  . APPENDECTOMY    . CHOLECYSTECTOMY N/A 05/04/2016   Procedure: LAPAROSCOPIC CHOLECYSTECTOMY WITH INTRAOPERATIVE CHOLANGIOGRAM;  Surgeon: Alphonsa Overall, MD;  Location: [facility];  Service: General;  Laterality: N/A;  . COLONOSCOPY  2004   negative; Dr Fuller Plan  . inguinal heriorrhaphy     R  . LYMPHADENECTOMY Bilateral 11/09/2015   Procedure: PELVIC LYMPHADENECTOMY;  Surgeon: Raynelle Bring, MD;  Location: [facility];  Service: Urology;  Laterality: Bilateral;  . PROSTATE BIOPSY  08/22/2015  . ROBOT ASSISTED LAPAROSCOPIC RADICAL PROSTATECTOMY N/A 11/09/2015   Procedure: ROBOTIC ASSISTED LAPAROSCOPIC RADICAL PROSTATECTOMY LEVEL 2;  Surgeon: Raynelle Bring, MD;  [facility];  Service: Urology;  Laterality: N/A;    There were no vitals filed for this visit.      Subjective Assessment - 07/17/16 0851    Subjective L foot still really aggravating me.  Just got back from the beach.   Can I see Dr. Oneida Alar?    Currently in Pain? Yes   Pain Score 1    Pain Location Back   Pain Orientation Right   Pain Descriptors / Indicators Dull   Pain Type Chronic pain   Pain Onset More than a month ago   Pain Frequency Intermittent   Aggravating Factors  standing, walking up hill   Pain Relieving Factors sit, rest, heat   Multiple Pain Sites No          Pilates Reformer used for LE/core strength, postural strength, lumbopelvic disassociation and core control.  Exercises included: Footwork 4 springs for heel stretch , running and heel raises  Parallel with block between ankles, then knees  Single leg 3 springs manual cues to avoid hyperext and avoid inverting foot 30 reps x 2 with Rt LE  Bridging used yoga block for LE alignment, done on and off the footbar, decreased lumbar articulation.  4 springs x 10 x 2 sets.    Supine Arm work 1 Red 1 Yellow Arcs, x 10 mod cues for setup and execution   Feet in Straps 1 red for hamstring stretching and lateral hip.          [facility] Adult PT Treatment/Exercise - 07/17/16 0904      Knee/Hip Exercises: Aerobic   Elliptical 5 min level 4 ramp and level 5 resistance, pt  with decr Rt knee control , knee hyperext if heel stays down      Knee/Hip Exercises: Machines for Strengthening   Other Machine PIlates Reformer                 PT Short Term Goals - 07/03/16 0956      PT SHORT TERM GOAL #1   Title Pt will be I with HEP for initial strength/ROM including L UE    Status Achieved     PT SHORT TERM GOAL #2   Title Pt will be able to report less stiffness in AM and with transitions (sit to stand after sitting)   Status On-going     PT SHORT TERM GOAL #3   Title Pt will be able to use and understand good body mechanics and posture for mobility.    Status Partially Met     PT SHORT TERM GOAL #4   Title Pt will demo 4+/5 elbow extension for stability in weightbearing   Status Unable to assess           PT Long Term  Goals - 07/03/16 0955      PT LONG TERM GOAL #1   Title Pt will be I with HEP (more advanced) for core strength.    Status On-going     PT LONG TERM GOAL #2   Title Pt will be able to walk on uneven ground (beach, hike) for 1 hour with min to no pain    Status On-going     PT LONG TERM GOAL #3   Title Pt will increase hip abd and glute med strength to 4/5 for gait stability   Status Unable to assess     PT LONG TERM GOAL #4   Title Pt will be able to walk down steps with good alignment in knee/hips and min cues.    Status On-going     PT LONG TERM GOAL #5   Title Pt will be able to hold a full plank for 15 sec without elbow pain OR back pain.    Status On-going     PT LONG TERM GOAL #6   Title Pt will return to golfing (18 holes) without elbow pain.    Baseline 10 holes no incr elbow pain    Status Partially Met               Plan - 07/17/16 1028    Clinical Impression Statement Patient with Rt. LE weakness evident with Elliptical and Reformer ex.  Weakness in ankle evertors and quads, glute med addressed in supine with props and footwork.  May see Rolfer soon. Needs manual cueing due to decr proprioception.  Admittedly did not do HEP for the past week.     PT Next Visit Plan Check specific goals and encourage consistency. Core, hip and Rt LE strength, using Pilates   PT Home Exercise Plan biceps, triceps ext supine, ext stretching and countertop push ups.  Previous hip abd and gave step stretch for plantar fasciitis   Consulted and Agree with Plan of Care Patient      Patient will benefit from skilled therapeutic intervention in order to improve the following deficits and impairments:  Abnormal gait, Decreased range of motion, Increased fascial restricitons, Pain, Impaired flexibility, Hypomobility, Decreased mobility, Decreased strength, Postural dysfunction, Impaired UE functional use, Decreased balance, Decreased scar mobility  Visit Diagnosis: Other abnormalities  of gait and mobility  Muscle weakness (generalized)  Midline low back pain without sciatica  Stiffness of left elbow, not elsewhere classified     Problem List Patient Active Problem List   Diagnosis Date Noted  . Cholecystitis 05/04/2016  . Prostate cancer ([employer]) 11/09/2015  . Spinal stenosis of lumbar region 10/11/2015  . Weakness of right lower extremity 09/13/2015  . Malignant neoplasm of prostate ([place]) 09/06/2015  . 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 Lumbar radiculopathy, chronic 06/22/2015  . Abnormal prostate by palpation 02/24/2015  . Nonspecific ST-T changes 02/16/2013  . Family history of prostate cancer 02/16/2013  . Hyperlipidemia 09/30/2011  . Basal cell cancer 09/30/2011    Tametria Aho 07/17/2016, 10:32 AM  [facility] [address] [place], [place], [address] Phone: [phone]   Fax:  [phone]  Name: BERMAN GRAINGER MRN: [id number] Date of Birth: [birth year]   Raeford Razor, PT 07/17/16 10:36 AM Phone: [phone] Fax: [phone]

## 2016-07-24 ENCOUNTER — Ambulatory Visit: Payer: Medicare Other | Admitting: Physical Therapy

## 2016-07-24 ENCOUNTER — Encounter: Payer: Medicare Other | Admitting: Physical Therapy

## 2016-07-24 DIAGNOSIS — M25622 Stiffness of left elbow, not elsewhere classified: Secondary | ICD-10-CM

## 2016-07-24 DIAGNOSIS — M6281 Muscle weakness (generalized): Secondary | ICD-10-CM | POA: Diagnosis not present

## 2016-07-24 DIAGNOSIS — R2689 Other abnormalities of gait and mobility: Secondary | ICD-10-CM

## 2016-07-24 DIAGNOSIS — M545 Low back pain, unspecified: Secondary | ICD-10-CM

## 2016-07-24 NOTE — Therapy (Signed)
[place], [place], [address] Phone: [phone]   Fax:  [phone]  Physical Therapy Treatment  Patient Details  Name: Dale Holmes MRN: [id number] Date of Birth: Mar 17, 1951 Referring Provider: Dr. Berenice Primas  Encounter Date: 07/24/2016      PT End of Session - 07/24/16 1512    Visit Number 6   Number of Visits 16   Date for PT Re-Evaluation 08/07/16   PT Start Time 6754   PT Stop Time 1505   PT Time Calculation (min) 45 min   Activity Tolerance Patient tolerated treatment well   Behavior During Therapy [facility] for tasks assessed/performed      Past Medical History:  Diagnosis Date  . Dysrhythmia    irregular heart beat   . Herniated disc, cervical    history of  . Hx of basal cell carcinoma   . Hx of cardiac arrhythmia   . Hx of cardiac murmur   . Hx of hypercholesterolemia   . Hyperlipidemia 2007   NMR LDL goal = < 100  . Prostate cancer ([place]) 08/22/15  . Skin cancer     Past Surgical History:  Procedure Laterality Date  . APPENDECTOMY    . CHOLECYSTECTOMY N/A 05/04/2016   Procedure: LAPAROSCOPIC CHOLECYSTECTOMY WITH INTRAOPERATIVE CHOLANGIOGRAM;  Surgeon: Alphonsa Overall, MD;  [facility];  Service: General;  Laterality: N/A;  . COLONOSCOPY  2004   negative; Dr Fuller Plan  . inguinal heriorrhaphy     R  . LYMPHADENECTOMY Bilateral 11/09/2015   Procedure: PELVIC LYMPHADENECTOMY;  Surgeon: Raynelle Bring, MD;  [facility];  Service: Urology;  Laterality: Bilateral;  . PROSTATE BIOPSY  08/22/2015  . ROBOT ASSISTED LAPAROSCOPIC RADICAL PROSTATECTOMY N/A 11/09/2015   Procedure: ROBOTIC ASSISTED LAPAROSCOPIC RADICAL PROSTATECTOMY LEVEL 2;  Surgeon: Raynelle Bring, MD;  [facility];  Service: Urology;  Laterality: N/A;    There were no vitals filed for this visit.      Subjective Assessment - 07/24/16 1423    Subjective Sees Dr. Oneida Alar for L foot next week.  Denies back and elbow pain.     Currently in Pain? No/denies            [facility] Adult PT Treatment/Exercise - 07/24/16 1435      Self-Care   Heat/Ice Application Ice vs heat    Other Self-Care Comments  tennis ball rolling for plantar fascia, foot stretcher for PM hours      Lumbar Exercises: Supine   Other Supine Lumbar Exercises pilates Reformer: footwork 2 Red 1 blue calf stretching, running and heel raises Cues for core engagement    Other Supine Lumbar Exercises Single leg hamstring stretch 1 red and ITB 30 sec x 3 each leg      Knee/Hip Exercises: Machines for Strengthening   Cybex Knee Extension 3 sets x 15 reps 25 lbs concnetric and ecetnric double and single      Knee/Hip Exercises: Standing   Lateral Step Up Both;1 set;20 reps;Hand Hold: 1   Lateral Step Up Limitations 8 inch intermittent UE assist    Rocker Board Limitations Slant board high level x 30 sec x 3 bilateral.    Other Standing Knee Exercises Monster walking blue band 4 x 20, Rt. pelvis elevated, rotated    Other Standing Knee Exercises hip ex and abd blue band x 15 reps                 PT Education - 07/24/16 1512  Education provided Yes   Education Details see self care    Person(s) Educated Patient   Methods Explanation   Comprehension Verbalized understanding;Returned demonstration;Need further instruction          PT Short Term Goals - 07/03/16 0956      PT SHORT TERM GOAL #1   Title Pt will be I with HEP for initial strength/ROM including L UE    Status Achieved     PT SHORT TERM GOAL #2   Title Pt will be able to report less stiffness in AM and with transitions (sit to stand after sitting)   Status On-going     PT SHORT TERM GOAL #3   Title Pt will be able to use and understand good body mechanics and posture for mobility.    Status Partially Met     PT SHORT TERM GOAL #4   Title Pt will demo 4+/5 elbow extension for stability in weightbearing   Status Unable to assess           PT Long Term  Goals - 07/24/16 1518      PT LONG TERM GOAL #1   Title Pt will be I with HEP (more advanced) for core strength.    Status On-going     PT LONG TERM GOAL #2   Title Pt will be able to walk on uneven ground (beach, hike) for 1 hour with min to no pain    Status On-going     PT LONG TERM GOAL #3   Title Pt will increase hip abd and glute med strength to 4/5 for gait stability   Status On-going     PT LONG TERM GOAL #4   Title Pt will be able to walk down steps with good alignment in knee/hips and min cues.    Status On-going     PT LONG TERM GOAL #5   Title Pt will be able to hold a full plank for 15 sec without elbow pain OR back pain.    Status On-going     PT LONG TERM GOAL #6   Title Pt will return to golfing (18 holes) without elbow pain.    Status Partially Met               Plan - 07/24/16 1512    Clinical Impression Statement Continues to improve with body awareness and hip stability. Pt is now recognizing when he is out of alignment.  He has struggled with proprioception a great deal since he began PT.     PT Next Visit Plan Check specific goals and encourage consistency. Core, hip and Rt LE strength, using Pilates. PF?   PT Home Exercise Plan biceps, triceps ext supine, ext stretching and countertop push ups.  Previous hip abd and gave step stretch for plantar fasciitis   Consulted and Agree with Plan of Care Patient      Patient will benefit from skilled therapeutic intervention in order to improve the following deficits and impairments:  Abnormal gait, Decreased range of motion, Increased fascial restricitons, Pain, Impaired flexibility, Hypomobility, Decreased mobility, Decreased strength, Postural dysfunction, Impaired UE functional use, Decreased balance, Decreased scar mobility  Visit Diagnosis: Other abnormalities of gait and mobility  Muscle weakness (generalized)  Midline low back pain without sciatica  Stiffness of left elbow, not elsewhere  classified     Problem List Patient Active Problem List   Diagnosis Date Noted  . Cholecystitis 05/04/2016  . Prostate cancer ([place]) 11/09/2015  .  Spinal stenosis of lumbar region 10/11/2015  . Weakness of right lower extremity 09/13/2015  . Malignant neoplasm of prostate ([place]) 09/06/2015  . 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 Lumbar radiculopathy, chronic 06/22/2015  . Abnormal prostate by palpation 02/24/2015  . Nonspecific ST-T changes 02/16/2013  . Family history of prostate cancer 02/16/2013  . Hyperlipidemia 09/30/2011  . Basal cell cancer 09/30/2011    Dale Holmes 07/24/2016, 7:54 PM  [facility] [address] [place], [place], [address] Phone: [phone]   Fax:  [phone]  Name: Dale Holmes MRN: [id number] Date of Birth: [birth year]   Raeford Razor, PT 07/24/16 7:56 PM Phone: [phone] Fax: [phone]

## 2016-07-31 ENCOUNTER — Ambulatory Visit: Payer: Medicare Other | Attending: Sports Medicine | Admitting: Physical Therapy

## 2016-07-31 ENCOUNTER — Ambulatory Visit (INDEPENDENT_AMBULATORY_CARE_PROVIDER_SITE_OTHER): Payer: Medicare Other | Admitting: Sports Medicine

## 2016-07-31 ENCOUNTER — Encounter: Payer: Self-pay | Admitting: Sports Medicine

## 2016-07-31 DIAGNOSIS — M25622 Stiffness of left elbow, not elsewhere classified: Secondary | ICD-10-CM | POA: Diagnosis not present

## 2016-07-31 DIAGNOSIS — R2689 Other abnormalities of gait and mobility: Secondary | ICD-10-CM | POA: Insufficient documentation

## 2016-07-31 DIAGNOSIS — M79672 Pain in left foot: Secondary | ICD-10-CM | POA: Diagnosis not present

## 2016-07-31 DIAGNOSIS — M6281 Muscle weakness (generalized): Secondary | ICD-10-CM | POA: Insufficient documentation

## 2016-07-31 DIAGNOSIS — M545 Low back pain, unspecified: Secondary | ICD-10-CM

## 2016-07-31 NOTE — Progress Notes (Signed)
  Dale Holmes - 66 y.o. male MRN [id number]  Date of birth: 1951/06/20  SUBJECTIVE:  Including CC & ROS.  Chief Complaint  Patient presents with  . Foot Pain   Dale Holmes is a 65 year old male that is presenting with left foot pain. The pain is occurring on the plantar medial aspect of his left foot. The pain started in June after he has been to the beach. It started gradually nature has not gone away. He has tried ice and exercises with no improvement. The pain is localized and he denies any prior injury. He has a history of plantar fasciitis but this seems to be much worse. He has a history of right leg weakness that is stemming from foraminal stenosis of his back.  ROS: No unexpected weight loss, fever, chills, swelling, redness, otherwise see HPI    HISTORY: Past Medical, Surgical, Social, and Family History Reviewed & Updated per EMR.   Pertinent Historical Findings include: PMSHx -  Prostate cancer and surgery, cholecystectomy, broken elbow  PSHx -  Works at [employer] , no tobacco. Occasional alcohol use   DATA REVIEWED: None to review   PHYSICAL EXAM:  VS: BP:110/70  HR: bpm  TEMP: ( )  RESP:   HT:6\' 2"  (188 cm)   WT:185 lb (83.9 kg)  BMI:23.8 PHYSICAL EXAM: Gen: NAD, alert, cooperative with exam, well-appearing HEENT: clear conjunctiva, EOMI CV:  no edema, capillary refill brisk,  Resp: non-labored, normal speech Skin: no rashes, normal turgor  Neuro: no gross deficits.  Psych:  alert and oriented Left foot:  No obvious swelling, erythema, or ecchymosis. Tenderness to palpation over the medial aspect of the calcaneus. Normal range of motion. Normal strength in all quadrants. Some loss of his transverse arch. Longitudinal arch appears to be preserved. Gait: Has a swinging gait of his right side. Left foot with a neutral strike.  Limited ultrasound: Left foot: Plantar fascia was interviewed and long and short axis. Does not appear overly thickened  and there  is slightly a hyperechoic change on the medial aspect. No spurring observed.   ASSESSMENT & PLAN:   Left foot pain Possible that this is just a strain of his plantar fascia. Does not to be full-blown plantar fasciitis as his findings on ultrasound do not suggest that. -Placed in size 11.5-12.5 green insoles. - Scaphoid pads were placed in both green insoles. A heel lift was also placed in the left side. - Advised to follow-up in 3-4 weeks if no improvement and need to rescan with the ultrasound.

## 2016-07-31 NOTE — Therapy (Signed)
[place], [place], [address] Phone: [phone]   Fax:  [phone]  Physical Therapy Treatment  Patient Details  Name: Dale Holmes MRN: [id number] Date of Birth: Apr 11, 1951 Referring Provider: Dr. Berenice Primas  Encounter Date: 07/31/2016      PT End of Session - 07/31/16 1237    Visit Number 7   Number of Visits 16   Date for PT Re-Evaluation 08/07/16   PT Start Time 0845   PT Stop Time 0932   PT Time Calculation (min) 47 min   Activity Tolerance Patient tolerated treatment well   Behavior During Therapy [facility] for tasks assessed/performed      Past Medical History:  Diagnosis Date  . Dysrhythmia    irregular heart beat   . Herniated disc, cervical    history of  . Hx of basal cell carcinoma   . Hx of cardiac arrhythmia   . Hx of cardiac murmur   . Hx of hypercholesterolemia   . Hyperlipidemia 2007   NMR LDL goal = < 100  . Prostate cancer ([place]) 08/22/15  . Skin cancer     Past Surgical History:  Procedure Laterality Date  . APPENDECTOMY    . CHOLECYSTECTOMY N/A 05/04/2016   Procedure: LAPAROSCOPIC CHOLECYSTECTOMY WITH INTRAOPERATIVE CHOLANGIOGRAM;  Surgeon: Alphonsa Overall, MD;  [facility];  Service: General;  Laterality: N/A;  . COLONOSCOPY  2004   negative; Dr Fuller Plan  . inguinal heriorrhaphy     R  . LYMPHADENECTOMY Bilateral 11/09/2015   Procedure: PELVIC LYMPHADENECTOMY;  Surgeon: Raynelle Bring, MD;  [facility];  Service: Urology;  Laterality: Bilateral;  . PROSTATE BIOPSY  08/22/2015  . ROBOT ASSISTED LAPAROSCOPIC RADICAL PROSTATECTOMY N/A 11/09/2015   Procedure: ROBOTIC ASSISTED LAPAROSCOPIC RADICAL PROSTATECTOMY LEVEL 2;  Surgeon: Raynelle Bring, MD;  [facility];  Service: Urology;  Laterality: N/A;    There were no vitals filed for this visit.      Subjective Assessment - 07/31/16 0847    Subjective Foot 4/10 at rest, current yesterday throbbing 8/10.  Sees Fields  today. [place] tomorrow, no elbow complaints.  Cant set it down on countertop. I dont notice my back as much when my foot is throbbing.    Currently in Pain? Yes   Pain Score 1    Pain Location Back   Pain Orientation Right   Pain Descriptors / Indicators Aching;Dull   Pain Type Chronic pain   Pain Radiating Towards prox R hip   Pain Onset More than a month ago   Pain Frequency Intermittent   Aggravating Factors  standing, walking, up hill esp    Pain Relieving Factors sit, rest and heat    Multiple Pain Sites Yes   Pain Score 4   Pain Location Foot   Pain Orientation Left   Pain Type Chronic pain   Pain Onset More than a month ago   Pain Frequency Intermittent   Aggravating Factors  walking without shoes, firs thing in the AM   Pain Relieving Factors gentle stretching, it comes and goes              [facility] Adult PT Treatment/Exercise - 07/31/16 0900      Self-Care   Other Self-Care Comments  use of spiky ball under L foot for MFR , static hold and then rolling      Lumbar Exercises: Sidelying   Other Sidelying Lumbar Exercises plank 2 ways (15 sec) and sideplank on Rt. x  15 sec      Knee/Hip Exercises: Stretches   Press photographer Both;2 reps;60 seconds   Gastroc Stretch Limitations slant board     Knee/Hip Exercises: Machines for Strengthening   Total Gym Leg Press 2- 3 plates, used blue band to increase hip abd activation, full ROM and iso holds  turnout x 20 with band, then calf stretching, heel raises 2      Knee/Hip Exercises: Standing   Step Down Both;2 sets;20 reps;Hand Hold: 1;Step Height: 4";Step Height: 6"                PT Education - 07/31/16 1237    Education Details MFR to L foot, stretching   Person(s) Educated Patient   Methods Explanation   Comprehension Verbalized understanding          PT Short Term Goals - 07/31/16 1241      PT SHORT TERM GOAL #1   Title Pt will be I with HEP for initial strength/ROM including L UE    Status  Achieved     PT SHORT TERM GOAL #2   Title Pt will be able to report less stiffness in AM and with transitions (sit to stand after sitting)   Status On-going     PT SHORT TERM GOAL #3   Title Pt will be able to use and understand good body mechanics and posture for mobility.    Status Partially Met     PT SHORT TERM GOAL #4   Title Pt will demo 4+/5 elbow extension for stability in weightbearing   Status Achieved           PT Long Term Goals - 07/31/16 0847      PT LONG TERM GOAL #1   Title Pt will be I with HEP (more advanced) for core strength.    Status On-going     PT LONG TERM GOAL #2   Title Pt will be able to walk on uneven ground (beach, hike) for 1 hour with min to no pain    Baseline has not attempted    Status On-going     PT LONG TERM GOAL #3   Title Pt will increase hip abd and glute med strength to 4/5 for gait stability   Baseline R 4-/5 to L 4/5 Rt leg weaker    Status Partially Met     PT LONG TERM GOAL #4   Title Pt will be able to walk down steps with good alignment in knee/hips and min cues.      PT LONG TERM GOAL #6   Title Pt will return to golfing (18 holes) without elbow pain.    Baseline 9 holes    Status Partially Met               Plan - 07/31/16 1239    Clinical Impression Statement Patient is bothered most by his L foot.  Demonstrated better control of knee and pelvis with step downs.  Min improvement in hip abd weakness. Will likely DC elbow, hopefully we can add treatment for plantair fasciitis.    PT Next Visit Plan  Core, hip and Rt LE strength, using Pilates. PF?   PT Home Exercise Plan biceps, triceps ext supine, ext stretching and countertop push ups.  Previous hip abd and gave step stretch for plantar fasciitis   Consulted and Agree with Plan of Care Patient      Patient will benefit from skilled therapeutic intervention in order to improve the  following deficits and impairments:  Abnormal gait, Decreased range of  motion, Increased fascial restricitons, Pain, Impaired flexibility, Hypomobility, Decreased mobility, Decreased strength, Postural dysfunction, Impaired UE functional use, Decreased balance, Decreased scar mobility  Visit Diagnosis: Other abnormalities of gait and mobility  Muscle weakness (generalized)  Midline low back pain without sciatica  Stiffness of left elbow, not elsewhere classified     Problem List Patient Active Problem List   Diagnosis Date Noted  . Cholecystitis 05/04/2016  . Prostate cancer ([place]) 11/09/2015  . Spinal stenosis of lumbar region 10/11/2015  . Weakness of right lower extremity 09/13/2015  . Malignant neoplasm of prostate ([place]) 09/06/2015  . 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 Lumbar radiculopathy, chronic 06/22/2015  . Abnormal prostate by palpation 02/24/2015  . Nonspecific ST-T changes 02/16/2013  . Family history of prostate cancer 02/16/2013  . Hyperlipidemia 09/30/2011  . Basal cell cancer 09/30/2011    Vanna Shavers 07/31/2016, 12:43 PM  [facility] [address] [place], [place], [address] Phone: [phone]   Fax:  [phone]  Name: Dale Holmes MRN: [id number] Date of Birth: 1951-04-14   Raeford Razor, PT 07/31/16 12:43 PM Phone: [phone] Fax: [phone]

## 2016-08-01 DIAGNOSIS — M79672 Pain in left foot: Secondary | ICD-10-CM | POA: Insufficient documentation

## 2016-08-01 DIAGNOSIS — M25522 Pain in left elbow: Secondary | ICD-10-CM | POA: Diagnosis not present

## 2016-08-01 NOTE — Assessment & Plan Note (Signed)
Possible that this is just a strain of his plantar fascia. Does not to be full-blown plantar fasciitis as his findings on ultrasound do not suggest that. -Placed in size 11.5-12.5 green insoles. - Scaphoid pads were placed in both green insoles. A heel lift was also placed in the left side. - Advised to follow-up in 3-4 weeks if no improvement and need to rescan with the ultrasound.

## 2016-08-07 ENCOUNTER — Ambulatory Visit: Payer: Medicare Other | Admitting: Physical Therapy

## 2016-08-07 DIAGNOSIS — M545 Low back pain, unspecified: Secondary | ICD-10-CM

## 2016-08-07 DIAGNOSIS — M25622 Stiffness of left elbow, not elsewhere classified: Secondary | ICD-10-CM

## 2016-08-07 DIAGNOSIS — M6281 Muscle weakness (generalized): Secondary | ICD-10-CM | POA: Diagnosis not present

## 2016-08-07 DIAGNOSIS — R2689 Other abnormalities of gait and mobility: Secondary | ICD-10-CM

## 2016-08-07 NOTE — Therapy (Signed)
[place], [place], [address] Phone: [phone]   Fax:  [phone]  Physical Therapy Treatment  Patient Details  Name: Dale Holmes MRN: [id number] Date of Birth: 04-Aug-1951 Referring Provider: Dr. Berenice Primas  Encounter Date: 08/07/2016      PT End of Session - 08/07/16 1231    Visit Number 8   Number of Visits 16   Date for PT Re-Evaluation 08/07/16   PT Start Time 5825   PT Stop Time 0929   PT Time Calculation (min) 42 min   Activity Tolerance Patient tolerated treatment well   Behavior During Therapy [facility] for tasks assessed/performed      Past Medical History:  Diagnosis Date  . Dysrhythmia    irregular heart beat   . Herniated disc, cervical    history of  . Hx of basal cell carcinoma   . Hx of cardiac arrhythmia   . Hx of cardiac murmur   . Hx of hypercholesterolemia   . Hyperlipidemia 2007   NMR LDL goal = < 100  . Prostate cancer ([place]) 08/22/15  . Skin cancer     Past Surgical History:  Procedure Laterality Date  . APPENDECTOMY    . CHOLECYSTECTOMY N/A 05/04/2016   Procedure: LAPAROSCOPIC CHOLECYSTECTOMY WITH INTRAOPERATIVE CHOLANGIOGRAM;  Surgeon: Alphonsa Overall, MD;  Location: [facility];  Service: General;  Laterality: N/A;  . COLONOSCOPY  2004   negative; Dr Fuller Plan  . inguinal heriorrhaphy     R  . LYMPHADENECTOMY Bilateral 11/09/2015   Procedure: PELVIC LYMPHADENECTOMY;  Surgeon: Raynelle Bring, MD;  Location: [facility];  Service: Urology;  Laterality: Bilateral;  . PROSTATE BIOPSY  08/22/2015  . ROBOT ASSISTED LAPAROSCOPIC RADICAL PROSTATECTOMY N/A 11/09/2015   Procedure: ROBOTIC ASSISTED LAPAROSCOPIC RADICAL PROSTATECTOMY LEVEL 2;  Surgeon: Raynelle Bring, MD;  Location: [facility];  Service: Urology;  Laterality: N/A;    There were no vitals filed for this visit.      Subjective Assessment - 08/07/16 1229    Subjective Saw both Dr. Oneida Alar and Dr. Berenice Primas for input about his L foot.  Going  to try conservative measures for 3-4 weeks.  Feels ready for DC.    Currently in Pain? Yes   Pain Score 1    Pain Location Back   Pain Orientation Right;Lower   Pain Descriptors / Indicators Dull   Pain Type Chronic pain            [facility] PT Assessment - 08/07/16 0001      Strength   Right Hip Flexion 4+/5   Right Hip Extension 4+/5   Right Hip ABduction 4/5   Left Hip Flexion 5/5   Left Hip Extension 5/5   Left Hip ABduction 4+/5   Right Knee Flexion 4+/5   Right Knee Extension 5/5              [facility] Adult PT Treatment/Exercise - 08/07/16 1241      Self-Care   Other Self-Care Comments  see education     Lumbar Exercises: Sidelying   Clam 20 reps   Hip Abduction 10 reps   Other Sidelying Lumbar Exercises plank (full on hands) x 15 sec x 3 no pain      Knee/Hip Exercises: Standing   Forward Step Up Left;2 sets;Hand Hold: 0;Step Height: 6"   Step Down Both;2 sets;20 reps;Hand Hold: 1;Step Height: 4";Step Height: 6"     QL stretch for Rt trunk  PT Education - 08-29-16 1231    Education provided Yes   Education Details ice massage for L foot, HEP, strength gains, resources for Pilates and massage therapy   Person(s) Educated Patient   Methods Explanation;Handout   Comprehension Verbalized understanding;Returned demonstration          PT Short Term Goals - 2016/08/29 0919      PT SHORT TERM GOAL #1   Title Pt will be I with HEP for initial strength/ROM including L UE    Status Achieved     PT SHORT TERM GOAL #2   Title Pt will be able to report less stiffness in AM and with transitions (sit to stand after sitting)   Status Achieved     PT SHORT TERM GOAL #3   Title Pt will be able to use and understand good body mechanics and posture for mobility.    Status Achieved     PT SHORT TERM GOAL #4   Title Pt will demo 4+/5 elbow extension for stability in weightbearing   Status Achieved           PT Long Term Goals - 2016/08/29 0915       PT LONG TERM GOAL #1   Title Pt will be I with HEP (more advanced) for core strength.    Status Achieved     PT LONG TERM GOAL #2   Title Pt will be able to walk on uneven ground (beach, hike) for 1 hour with min to no pain    Baseline golf course uses cart, has not gone hiking, walking 3 miles feels like Rt. foot drags   Status Unable to assess     PT LONG TERM GOAL #3   Title Pt will increase hip abd and glute med strength to 4/5 for gait stability   Status Achieved     PT LONG TERM GOAL #4   Title Pt will be able to walk down steps with good alignment in knee/hips and min cues.    Status Achieved     PT LONG TERM GOAL #5   Title Pt will be able to hold a full plank for 15 sec without elbow pain OR back pain.    Status Achieved     PT LONG TERM GOAL #6   Title Pt will return to golfing (18 holes) without elbow pain.    Status Achieved               Plan - 2016/08/29 1232    Clinical Impression Statement Several goals met, including hip strength. He has not done much walking on uneven ground or playing 18 holes of goal.  He may consider community Pilates class for further core and hip strengthening, conditioning.    PT Next Visit Plan NA   PT Home Exercise Plan independent    Consulted and Agree with Plan of Care Patient      Patient will benefit from skilled therapeutic intervention in order to improve the following deficits and impairments:     Visit Diagnosis: Other abnormalities of gait and mobility  Midline low back pain without sciatica  Muscle weakness (generalized)  Stiffness of left elbow, not elsewhere classified       G-Codes - 2016/08/29 1234    Functional Assessment Tool Used clinical judgement    Functional Limitation Mobility: Walking and moving around   Mobility: Walking and Moving Around Current Status ([id number]) At least 1 percent but less than 20 percent impaired, limited or restricted  Mobility: Walking and Moving Around Goal Status [phone])  At least 1 percent but less than 20 percent impaired, limited or restricted   Mobility: Walking and Moving Around Discharge Status [phone]) At least 1 percent but less than 20 percent impaired, limited or restricted      Problem List Patient Active Problem List   Diagnosis Date Noted  . Left foot pain 08/01/2016  . Cholecystitis 05/04/2016  . Prostate cancer ([place]) 11/09/2015  . Spinal stenosis of lumbar region 10/11/2015  . Weakness of right lower extremity 09/13/2015  . Malignant neoplasm of prostate ([place]) 09/06/2015  . 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 Lumbar radiculopathy, chronic 06/22/2015  . Abnormal prostate by palpation 02/24/2015  . Nonspecific ST-T changes 02/16/2013  . Family history of prostate cancer 02/16/2013  . Hyperlipidemia 09/30/2011  . Basal cell cancer 09/30/2011    PAA,JENNIFER 08/07/2016, 12:45 PM  [facility] [address] [place], [place], [address] Phone: [phone]   Fax:  [phone]  Name: Dale Holmes MRN: [id number] Date of Birth: 04/27/1951 PHYSICAL THERAPY DISCHARGE SUMMARY  Visits from Start of Care: 8  Current functional level related to goals / functional outcomes: See above    Remaining deficits: Gait,min back pain, hip and core weakness   Education / Equipment: HEP, posture, Pilates, RICE  Plan: Patient agrees to discharge.  Patient goals were met. Patient is being discharged due to meeting the stated rehab goals.  ?????    Raeford Razor, PT 08/07/16 12:49 PM Phone: [phone] Fax: [phone]

## 2016-08-14 ENCOUNTER — Encounter: Payer: Medicare Other | Admitting: Physical Therapy

## 2016-08-15 ENCOUNTER — Encounter: Payer: Self-pay | Admitting: Sports Medicine

## 2016-08-19 DIAGNOSIS — D225 Melanocytic nevi of trunk: Secondary | ICD-10-CM | POA: Diagnosis not present

## 2016-08-19 DIAGNOSIS — L738 Other specified follicular disorders: Secondary | ICD-10-CM | POA: Diagnosis not present

## 2016-08-19 DIAGNOSIS — Z85828 Personal history of other malignant neoplasm of skin: Secondary | ICD-10-CM | POA: Diagnosis not present

## 2016-08-19 DIAGNOSIS — D485 Neoplasm of uncertain behavior of skin: Secondary | ICD-10-CM | POA: Diagnosis not present

## 2016-08-23 DIAGNOSIS — H5203 Hypermetropia, bilateral: Secondary | ICD-10-CM | POA: Diagnosis not present

## 2016-08-23 DIAGNOSIS — H2511 Age-related nuclear cataract, right eye: Secondary | ICD-10-CM | POA: Diagnosis not present

## 2016-08-23 DIAGNOSIS — H5213 Myopia, bilateral: Secondary | ICD-10-CM | POA: Diagnosis not present

## 2016-08-24 ENCOUNTER — Encounter: Payer: Self-pay | Admitting: Sports Medicine

## 2016-08-24 DIAGNOSIS — Z23 Encounter for immunization: Secondary | ICD-10-CM | POA: Diagnosis not present

## 2016-10-09 ENCOUNTER — Other Ambulatory Visit: Payer: Self-pay | Admitting: Sports Medicine

## 2016-11-28 DIAGNOSIS — Z8546 Personal history of malignant neoplasm of prostate: Secondary | ICD-10-CM | POA: Diagnosis not present

## 2016-12-04 DIAGNOSIS — N5231 Erectile dysfunction following radical prostatectomy: Secondary | ICD-10-CM | POA: Diagnosis not present

## 2016-12-04 DIAGNOSIS — Z8546 Personal history of malignant neoplasm of prostate: Secondary | ICD-10-CM | POA: Diagnosis not present

## 2016-12-15 IMAGING — CR DG CHEST 2V
2 series · 2 of 2 positions shown · non-contrast
Comparison: CT chest 11/07/2015

CLINICAL DATA: Chest pain, epigastric pain.  Worse with movement.

EXAM:
CHEST  2 VIEW

[w chest pa]
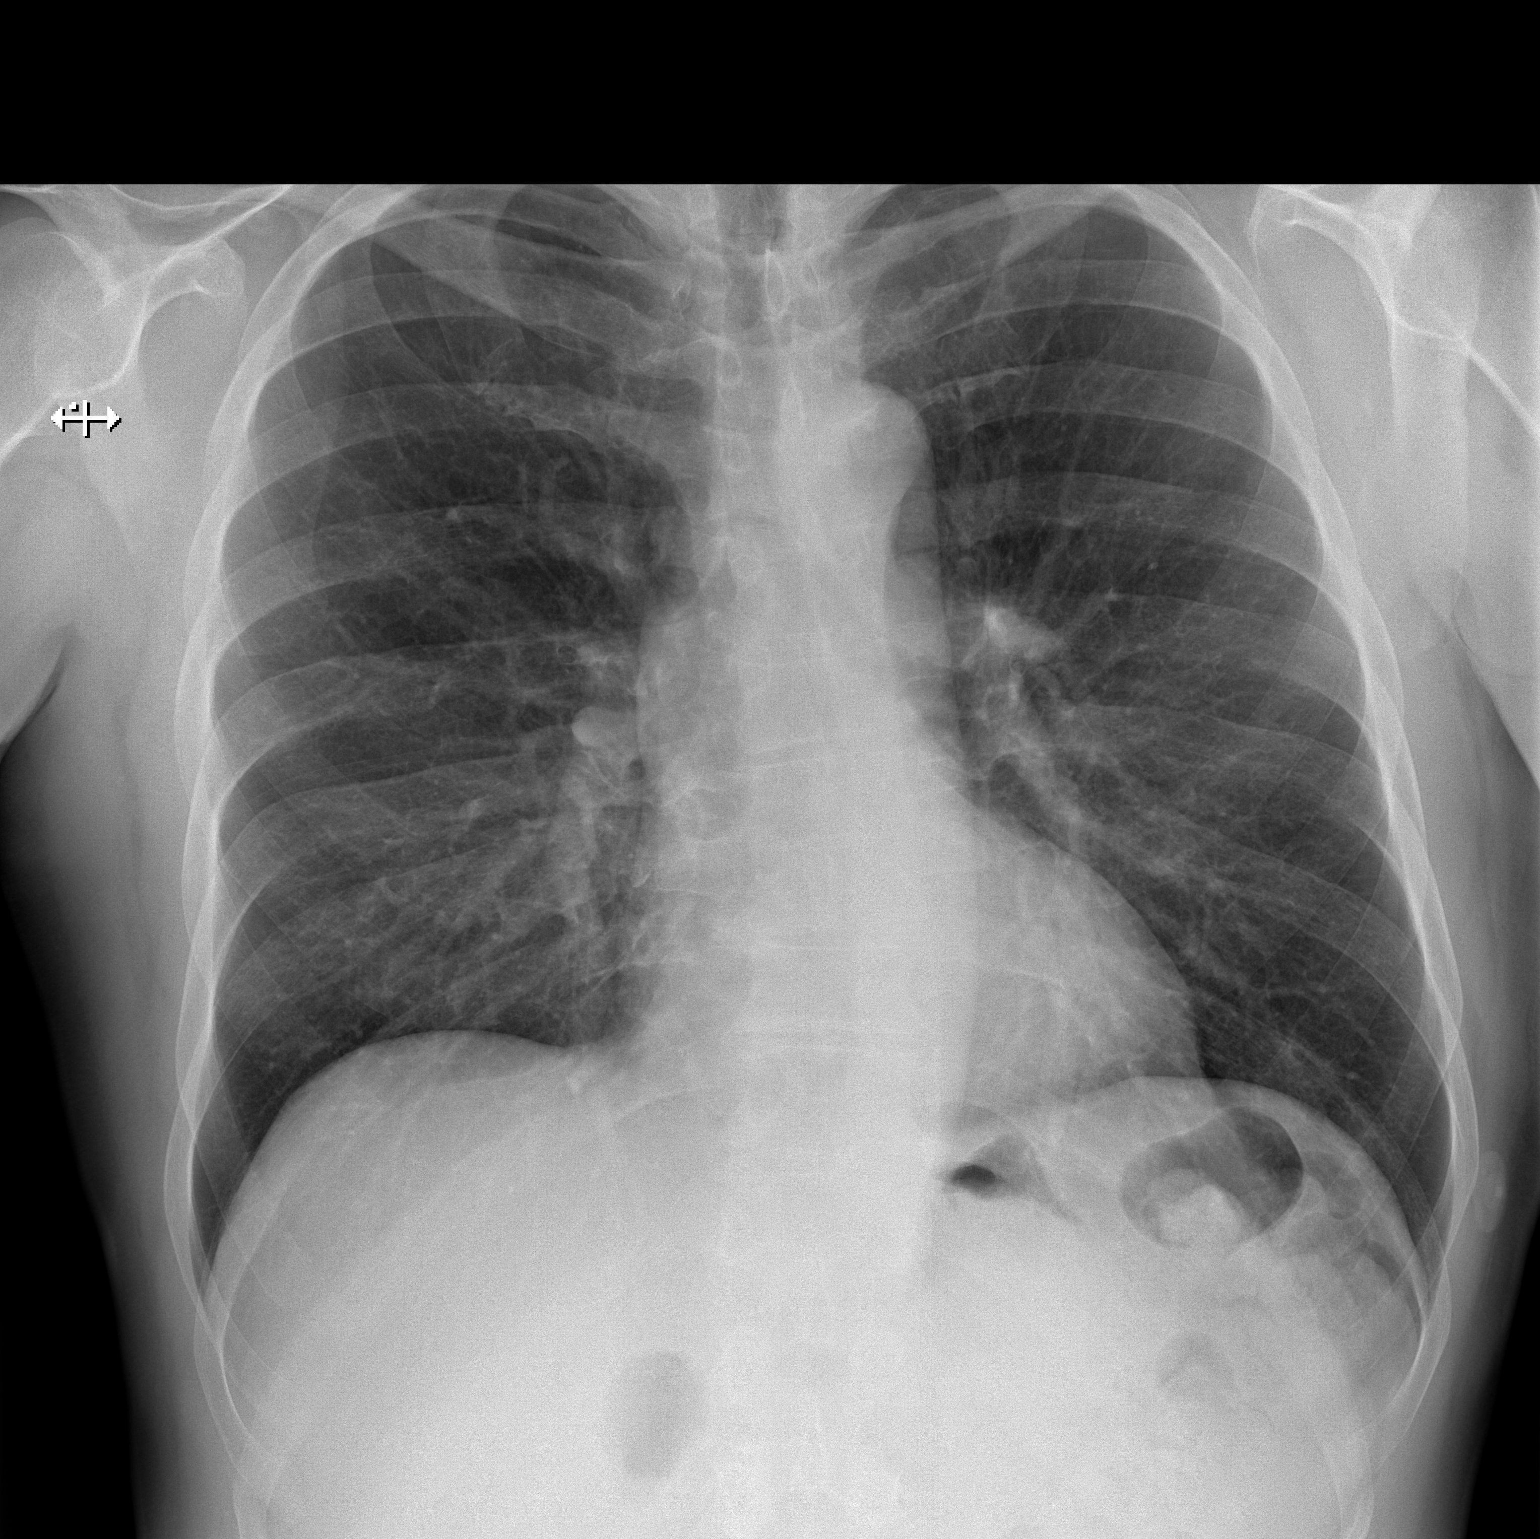

[w chest lat]
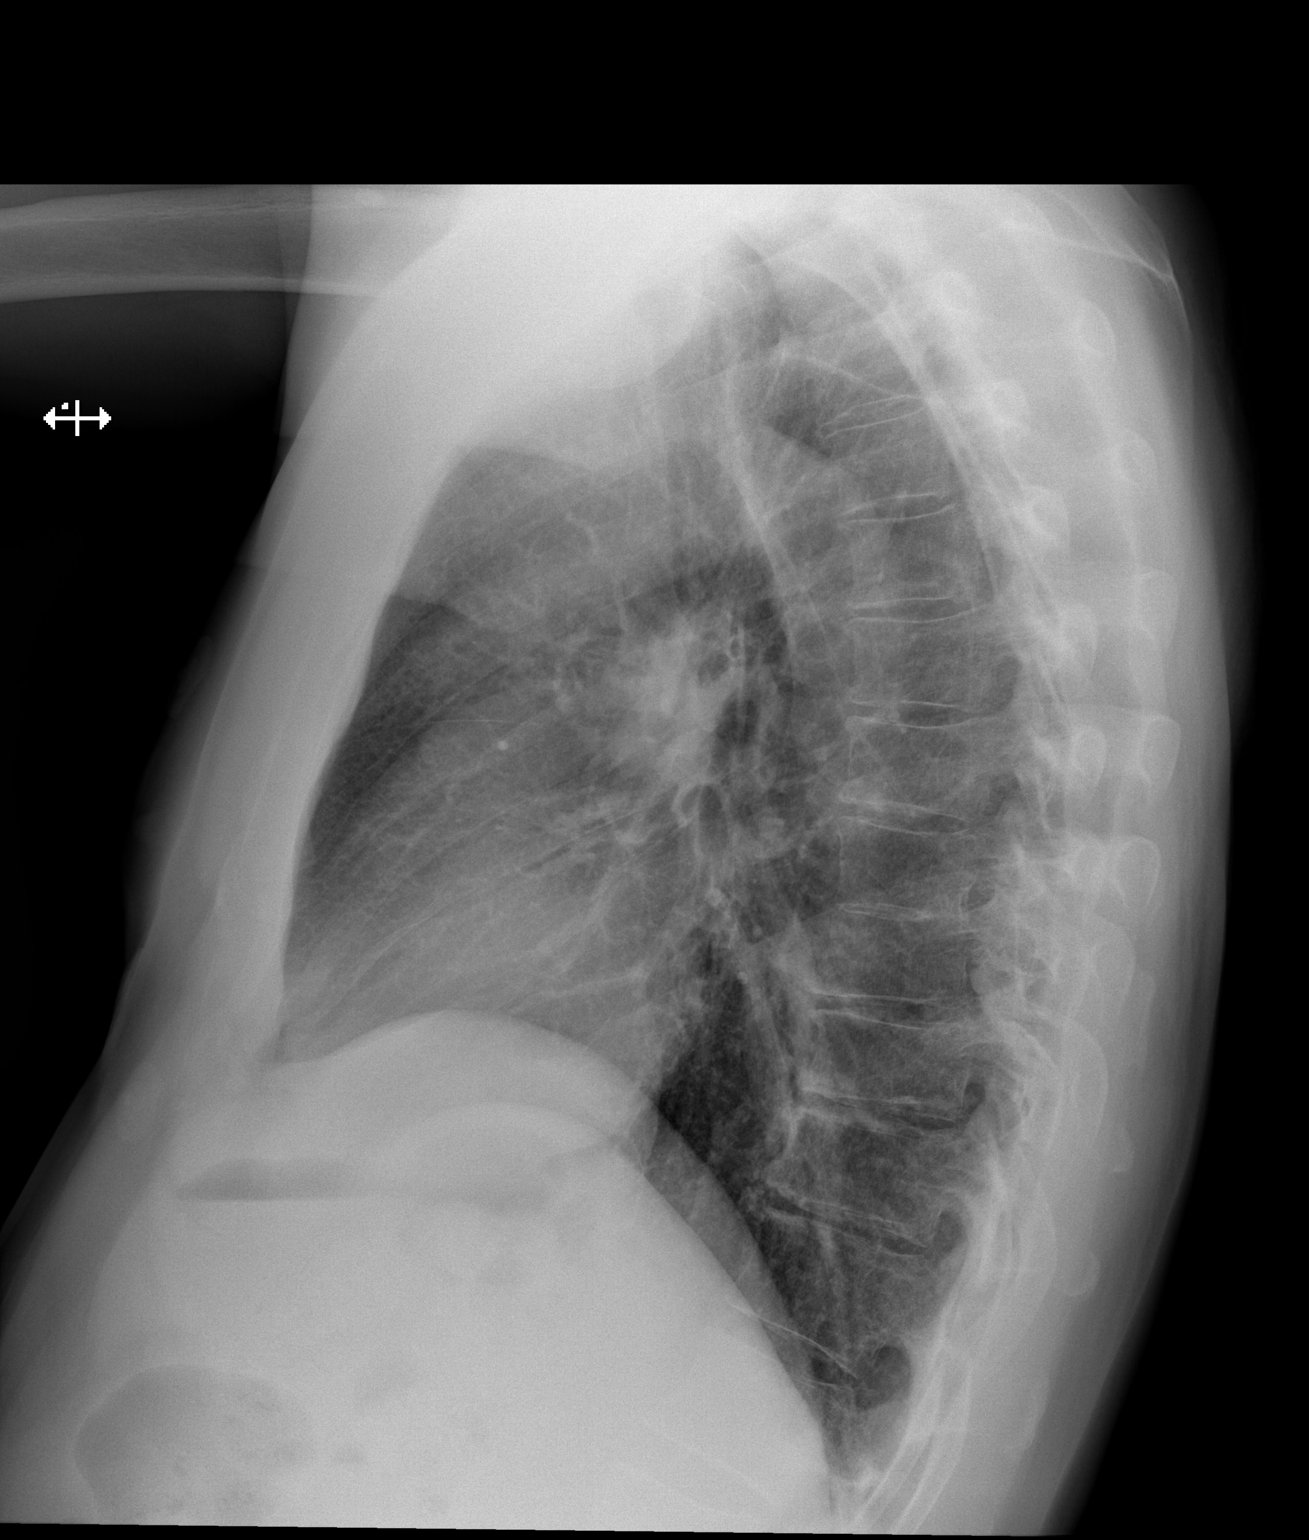

[2 of 2 positions shown; findings below may reference images not displayed]

FINDINGS: The heart size and mediastinal contours are within normal limits.
Both lungs are clear. The visualized skeletal structures are
unremarkable.
IMPRESSION: No active cardiopulmonary disease.

## 2017-02-25 DIAGNOSIS — E784 Other hyperlipidemia: Secondary | ICD-10-CM | POA: Diagnosis not present

## 2017-02-25 DIAGNOSIS — R8299 Other abnormal findings in urine: Secondary | ICD-10-CM | POA: Diagnosis not present

## 2017-02-25 DIAGNOSIS — Z125 Encounter for screening for malignant neoplasm of prostate: Secondary | ICD-10-CM | POA: Diagnosis not present

## 2017-03-03 DIAGNOSIS — Z0000 Encounter for general adult medical examination without abnormal findings: Secondary | ICD-10-CM | POA: Diagnosis not present

## 2017-03-03 DIAGNOSIS — Z23 Encounter for immunization: Secondary | ICD-10-CM | POA: Diagnosis not present

## 2017-03-03 DIAGNOSIS — M48061 Spinal stenosis, lumbar region without neurogenic claudication: Secondary | ICD-10-CM | POA: Diagnosis not present

## 2017-03-03 DIAGNOSIS — K810 Acute cholecystitis: Secondary | ICD-10-CM | POA: Diagnosis not present

## 2017-03-03 DIAGNOSIS — Z6825 Body mass index (BMI) 25.0-25.9, adult: Secondary | ICD-10-CM | POA: Diagnosis not present

## 2017-03-03 DIAGNOSIS — C61 Malignant neoplasm of prostate: Secondary | ICD-10-CM | POA: Diagnosis not present

## 2017-03-03 DIAGNOSIS — R3121 Asymptomatic microscopic hematuria: Secondary | ICD-10-CM | POA: Diagnosis not present

## 2017-03-03 DIAGNOSIS — E784 Other hyperlipidemia: Secondary | ICD-10-CM | POA: Diagnosis not present

## 2017-03-03 DIAGNOSIS — Z1389 Encounter for screening for other disorder: Secondary | ICD-10-CM | POA: Diagnosis not present

## 2017-03-03 DIAGNOSIS — I493 Ventricular premature depolarization: Secondary | ICD-10-CM | POA: Diagnosis not present

## 2017-03-18 DIAGNOSIS — Z1212 Encounter for screening for malignant neoplasm of rectum: Secondary | ICD-10-CM | POA: Diagnosis not present

## 2017-06-04 DIAGNOSIS — Z8546 Personal history of malignant neoplasm of prostate: Secondary | ICD-10-CM | POA: Diagnosis not present

## 2017-06-11 DIAGNOSIS — Z8546 Personal history of malignant neoplasm of prostate: Secondary | ICD-10-CM | POA: Diagnosis not present

## 2017-06-11 DIAGNOSIS — N5231 Erectile dysfunction following radical prostatectomy: Secondary | ICD-10-CM | POA: Diagnosis not present

## 2017-08-19 DIAGNOSIS — D2261 Melanocytic nevi of right upper limb, including shoulder: Secondary | ICD-10-CM | POA: Diagnosis not present

## 2017-08-19 DIAGNOSIS — L738 Other specified follicular disorders: Secondary | ICD-10-CM | POA: Diagnosis not present

## 2017-08-19 DIAGNOSIS — L918 Other hypertrophic disorders of the skin: Secondary | ICD-10-CM | POA: Diagnosis not present

## 2017-08-19 DIAGNOSIS — D2262 Melanocytic nevi of left upper limb, including shoulder: Secondary | ICD-10-CM | POA: Diagnosis not present

## 2017-08-19 DIAGNOSIS — B353 Tinea pedis: Secondary | ICD-10-CM | POA: Diagnosis not present

## 2017-08-19 DIAGNOSIS — D1801 Hemangioma of skin and subcutaneous tissue: Secondary | ICD-10-CM | POA: Diagnosis not present

## 2017-08-19 DIAGNOSIS — D225 Melanocytic nevi of trunk: Secondary | ICD-10-CM | POA: Diagnosis not present

## 2017-08-19 DIAGNOSIS — L821 Other seborrheic keratosis: Secondary | ICD-10-CM | POA: Diagnosis not present

## 2017-08-19 DIAGNOSIS — D2239 Melanocytic nevi of other parts of face: Secondary | ICD-10-CM | POA: Diagnosis not present

## 2017-08-19 DIAGNOSIS — Z85828 Personal history of other malignant neoplasm of skin: Secondary | ICD-10-CM | POA: Diagnosis not present

## 2017-08-19 DIAGNOSIS — L570 Actinic keratosis: Secondary | ICD-10-CM | POA: Diagnosis not present

## 2017-09-24 DIAGNOSIS — Z23 Encounter for immunization: Secondary | ICD-10-CM | POA: Diagnosis not present

## 2017-09-29 DIAGNOSIS — Z85828 Personal history of other malignant neoplasm of skin: Secondary | ICD-10-CM | POA: Diagnosis not present

## 2017-09-29 DIAGNOSIS — L570 Actinic keratosis: Secondary | ICD-10-CM | POA: Diagnosis not present

## 2017-09-29 DIAGNOSIS — Z79899 Other long term (current) drug therapy: Secondary | ICD-10-CM | POA: Diagnosis not present

## 2017-09-29 DIAGNOSIS — B351 Tinea unguium: Secondary | ICD-10-CM | POA: Diagnosis not present

## 2017-12-24 DIAGNOSIS — Z8546 Personal history of malignant neoplasm of prostate: Secondary | ICD-10-CM | POA: Diagnosis not present

## 2017-12-31 DIAGNOSIS — N5231 Erectile dysfunction following radical prostatectomy: Secondary | ICD-10-CM | POA: Diagnosis not present

## 2017-12-31 DIAGNOSIS — Z8546 Personal history of malignant neoplasm of prostate: Secondary | ICD-10-CM | POA: Diagnosis not present

## 2018-01-27 IMAGING — US US ABDOMEN COMPLETE
1 series · 14 of 25 positions shown · non-contrast
Comparison: None.

CLINICAL DATA: 65-year-old male with epigastric abdominal pain.

EXAM:
ABDOMEN ULTRASOUND COMPLETE

[Series 1: us abdomen complete · 0.25mm/px · 14 of 195 slices shown]
[im 1/195]
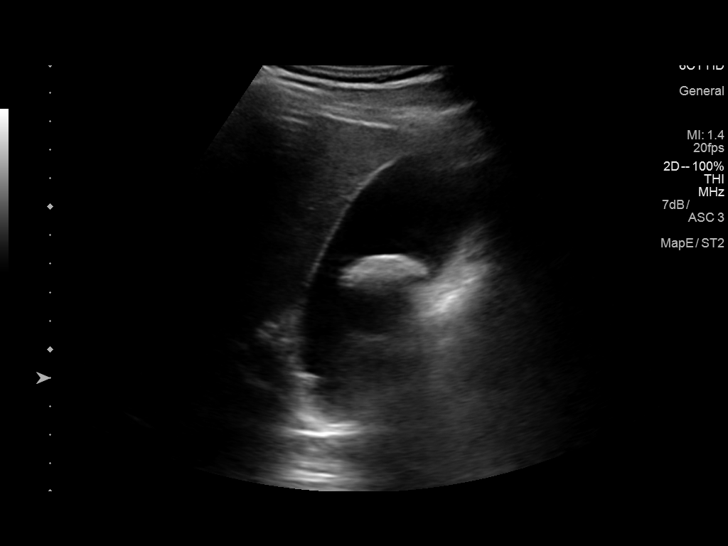
[im 17/195]
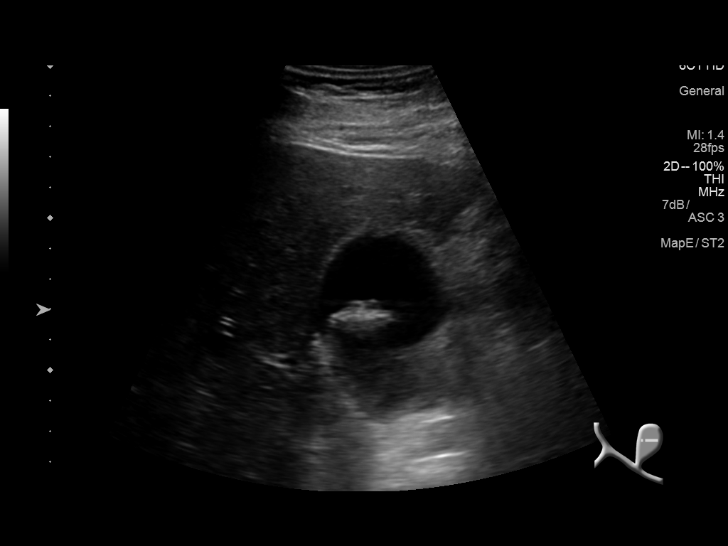
[im 33/195]
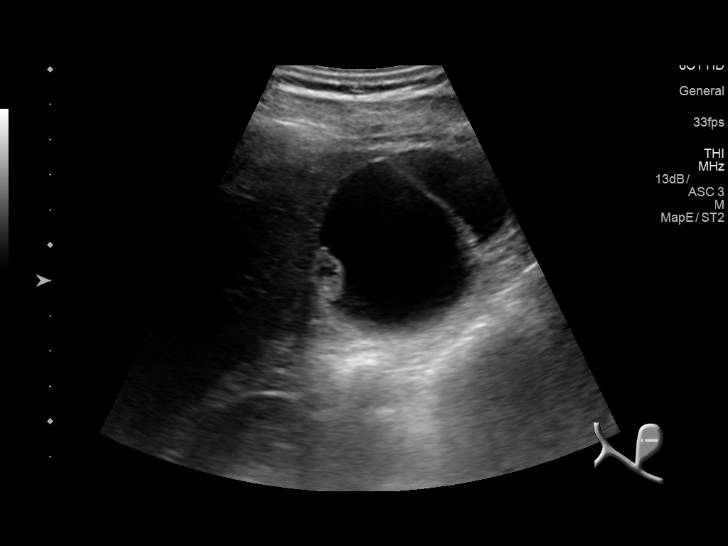
[im 49/195]
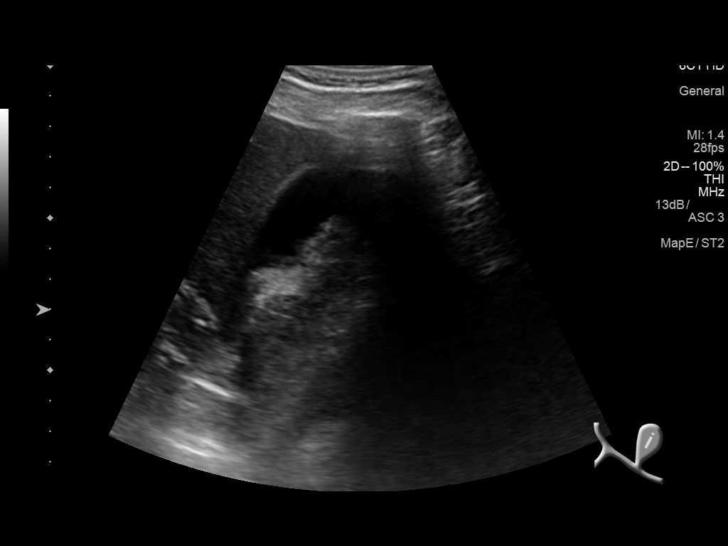
[im 65/195]
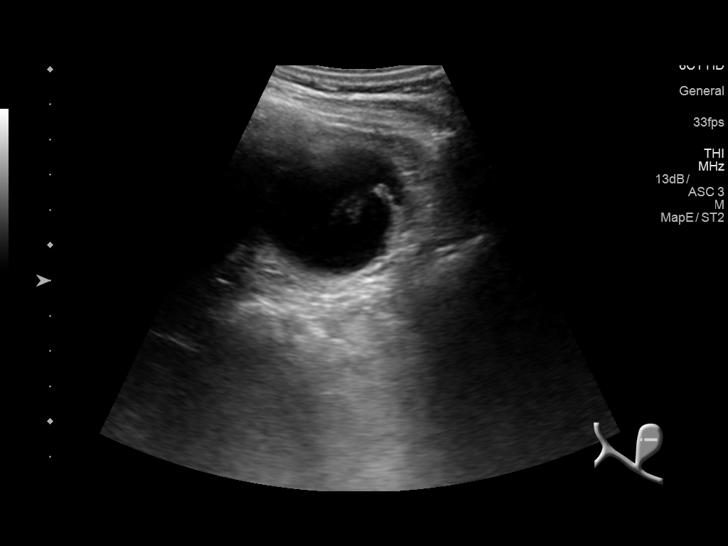
[im 73/195]
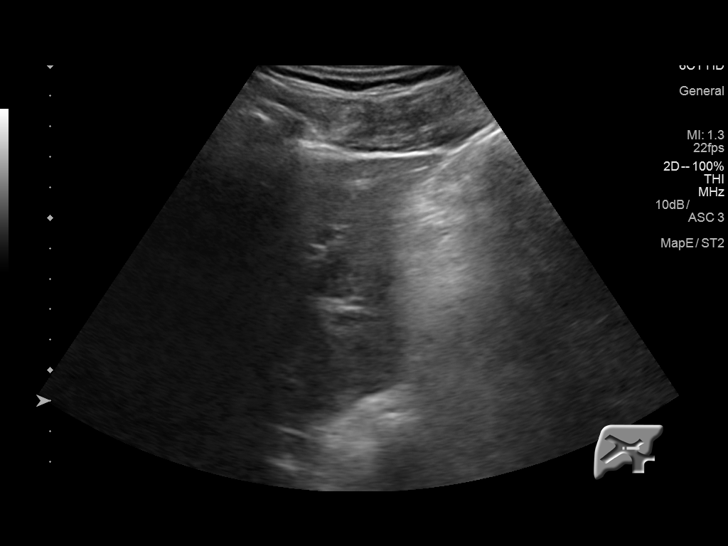
[im 89/195]
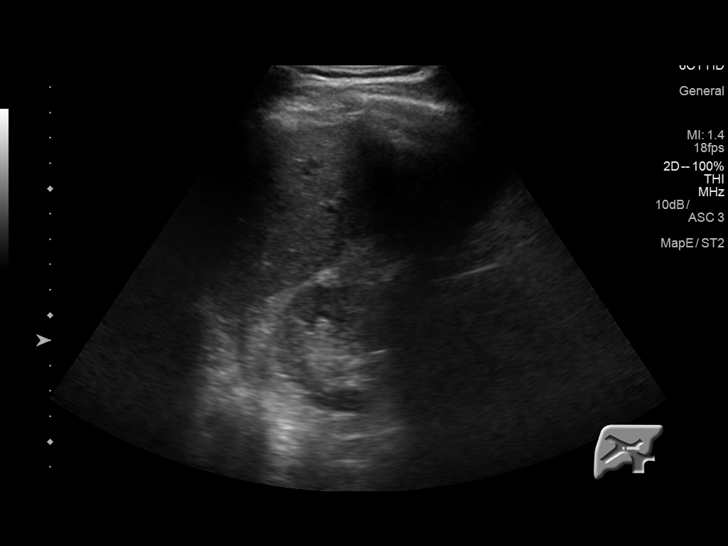
[im 106/195]
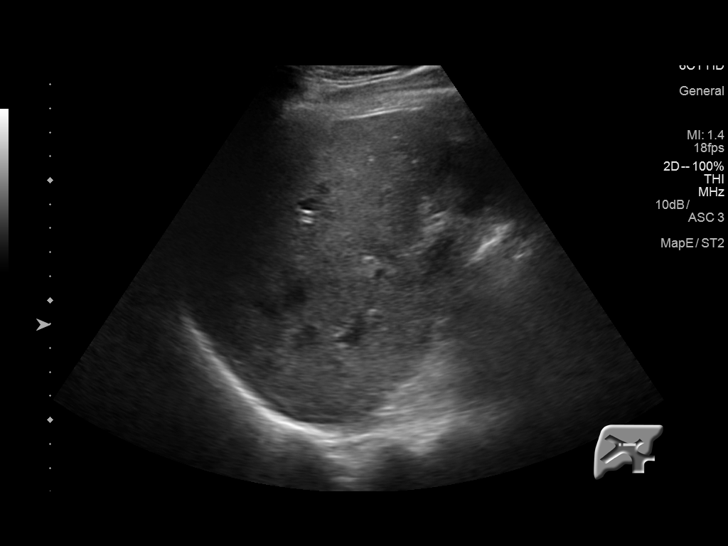
[im 122/195]
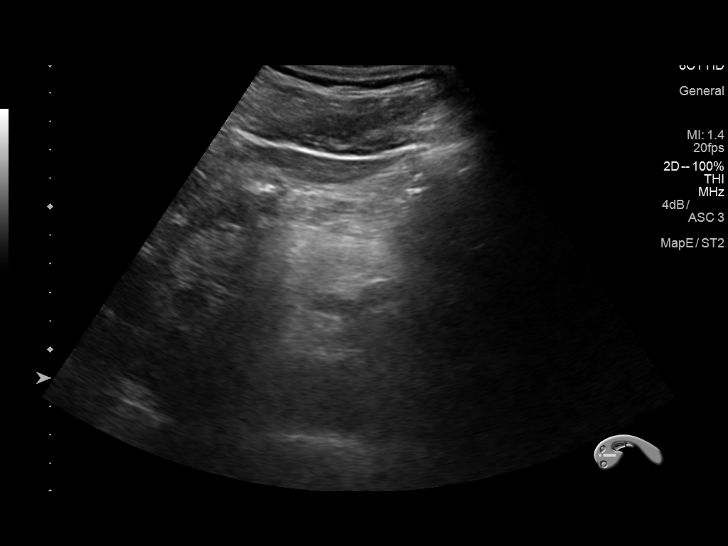
[im 130/195]
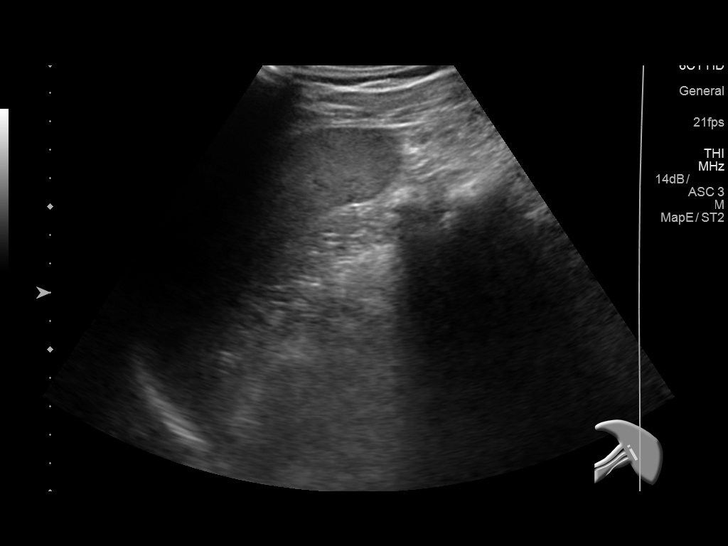
[im 146/195]
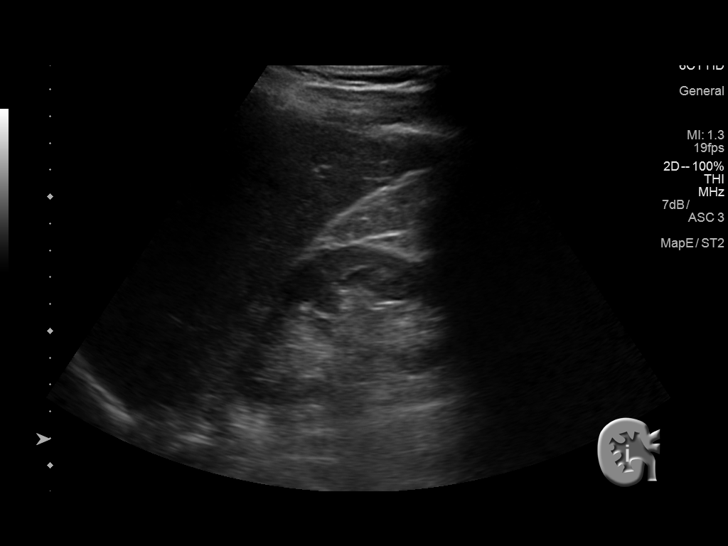
[im 162/195]
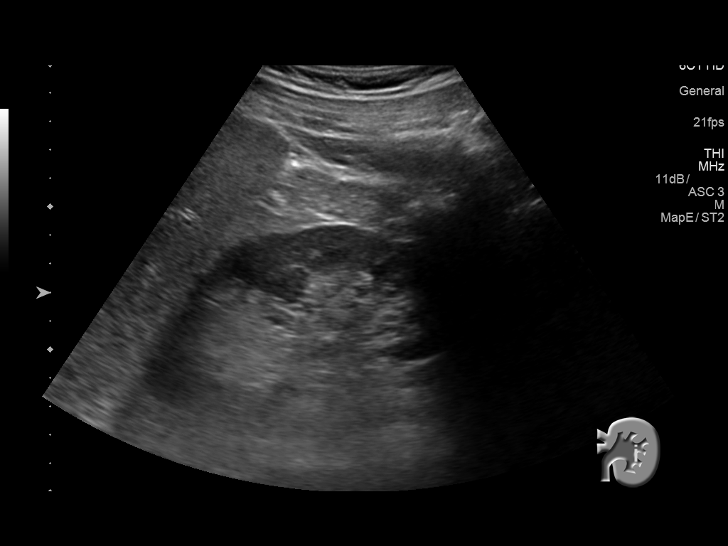
[im 178/195]
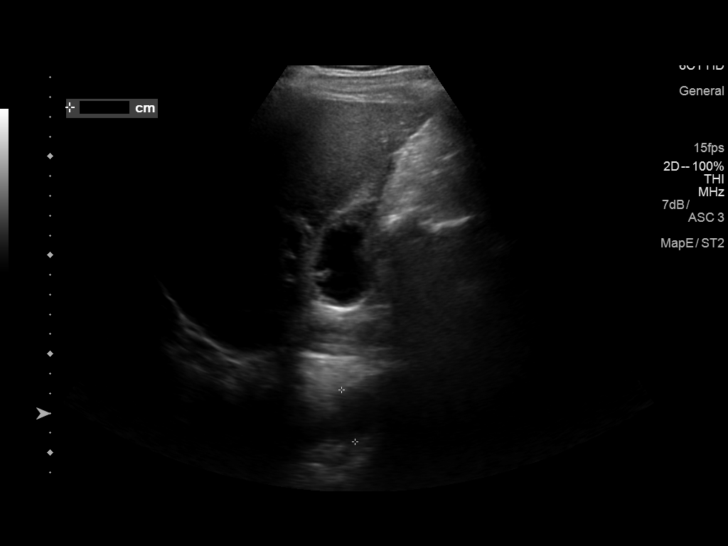
[im 195/195]
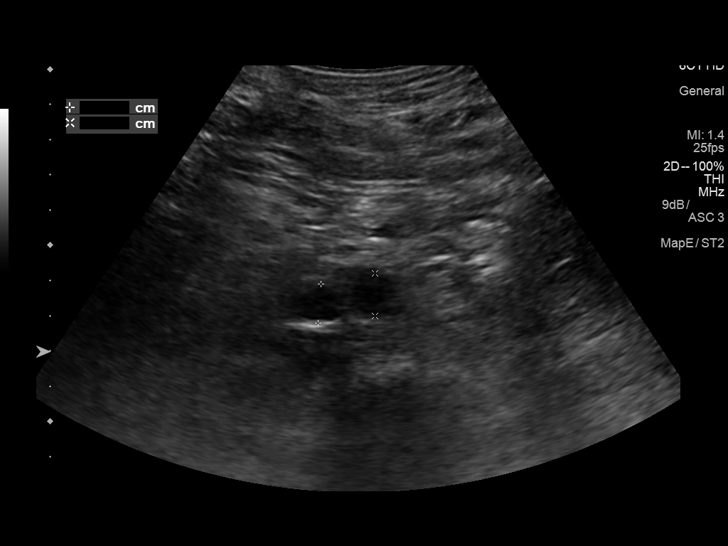

[14 of 25 positions shown; findings below may reference images not displayed]

FINDINGS: Gallbladder: There is stone within the gallbladder. Echogenic
material along the gallbladder wall most likely represent
gallbladder sludge/sludge ball. There is no gallbladder wall
thickening or pericholecystic.

Common bile duct: Diameter: 4 mm

Liver: No focal lesion identified. Within normal limits in
parenchymal echogenicity.

IVC: No abnormality visualized.

Pancreas: Grossly unremarkable as visualized.

Spleen: Size and appearance within normal limits.

Right Kidney: Length: 9 cm. Echogenicity within normal limits. No
mass or hydronephrosis visualized.

Left Kidney: Length: 11 send. Echogenicity within normal limits. No
mass or hydronephrosis visualized.

Abdominal aorta: No aneurysm visualized.

Other findings: None.
IMPRESSION: Gallstones and sludge without sonographic evidence acute
cholecystitis. Otherwise unremarkable abdominal ultrasound.

## 2018-03-23 DIAGNOSIS — E7849 Other hyperlipidemia: Secondary | ICD-10-CM | POA: Diagnosis not present

## 2018-03-23 DIAGNOSIS — R82998 Other abnormal findings in urine: Secondary | ICD-10-CM | POA: Diagnosis not present

## 2018-03-23 DIAGNOSIS — Z125 Encounter for screening for malignant neoplasm of prostate: Secondary | ICD-10-CM | POA: Diagnosis not present

## 2018-03-30 DIAGNOSIS — G2581 Restless legs syndrome: Secondary | ICD-10-CM | POA: Diagnosis not present

## 2018-03-30 DIAGNOSIS — Z1389 Encounter for screening for other disorder: Secondary | ICD-10-CM | POA: Diagnosis not present

## 2018-03-30 DIAGNOSIS — I493 Ventricular premature depolarization: Secondary | ICD-10-CM | POA: Diagnosis not present

## 2018-03-30 DIAGNOSIS — E7849 Other hyperlipidemia: Secondary | ICD-10-CM | POA: Diagnosis not present

## 2018-03-30 DIAGNOSIS — C61 Malignant neoplasm of prostate: Secondary | ICD-10-CM | POA: Diagnosis not present

## 2018-03-30 DIAGNOSIS — M48061 Spinal stenosis, lumbar region without neurogenic claudication: Secondary | ICD-10-CM | POA: Diagnosis not present

## 2018-03-30 DIAGNOSIS — Z0000 Encounter for general adult medical examination without abnormal findings: Secondary | ICD-10-CM | POA: Diagnosis not present

## 2018-03-30 DIAGNOSIS — Z6825 Body mass index (BMI) 25.0-25.9, adult: Secondary | ICD-10-CM | POA: Diagnosis not present

## 2018-03-31 DIAGNOSIS — Z1212 Encounter for screening for malignant neoplasm of rectum: Secondary | ICD-10-CM | POA: Diagnosis not present

## 2018-07-16 DIAGNOSIS — H2511 Age-related nuclear cataract, right eye: Secondary | ICD-10-CM | POA: Diagnosis not present

## 2018-07-29 DIAGNOSIS — Z8546 Personal history of malignant neoplasm of prostate: Secondary | ICD-10-CM | POA: Diagnosis not present

## 2018-08-07 DIAGNOSIS — Z8546 Personal history of malignant neoplasm of prostate: Secondary | ICD-10-CM | POA: Diagnosis not present

## 2018-08-07 DIAGNOSIS — N5231 Erectile dysfunction following radical prostatectomy: Secondary | ICD-10-CM | POA: Diagnosis not present

## 2018-08-19 DIAGNOSIS — D485 Neoplasm of uncertain behavior of skin: Secondary | ICD-10-CM | POA: Diagnosis not present

## 2018-08-19 DIAGNOSIS — L821 Other seborrheic keratosis: Secondary | ICD-10-CM | POA: Diagnosis not present

## 2018-08-19 DIAGNOSIS — Z85828 Personal history of other malignant neoplasm of skin: Secondary | ICD-10-CM | POA: Diagnosis not present

## 2018-08-19 DIAGNOSIS — D225 Melanocytic nevi of trunk: Secondary | ICD-10-CM | POA: Diagnosis not present

## 2018-08-19 DIAGNOSIS — B353 Tinea pedis: Secondary | ICD-10-CM | POA: Diagnosis not present

## 2018-09-14 DIAGNOSIS — Z23 Encounter for immunization: Secondary | ICD-10-CM | POA: Diagnosis not present

## 2018-12-31 DIAGNOSIS — M25522 Pain in left elbow: Secondary | ICD-10-CM | POA: Diagnosis not present

## 2018-12-31 DIAGNOSIS — M25551 Pain in right hip: Secondary | ICD-10-CM | POA: Diagnosis not present

## 2019-01-09 DIAGNOSIS — M25551 Pain in right hip: Secondary | ICD-10-CM | POA: Diagnosis not present

## 2019-01-18 DIAGNOSIS — S76291D Other injury of adductor muscle, fascia and tendon of right thigh, subsequent encounter: Secondary | ICD-10-CM | POA: Diagnosis not present

## 2019-01-25 DIAGNOSIS — S76291D Other injury of adductor muscle, fascia and tendon of right thigh, subsequent encounter: Secondary | ICD-10-CM | POA: Diagnosis not present

## 2019-01-25 DIAGNOSIS — Z8546 Personal history of malignant neoplasm of prostate: Secondary | ICD-10-CM | POA: Diagnosis not present

## 2019-01-27 DIAGNOSIS — S76291D Other injury of adductor muscle, fascia and tendon of right thigh, subsequent encounter: Secondary | ICD-10-CM | POA: Diagnosis not present

## 2019-02-02 DIAGNOSIS — S76291D Other injury of adductor muscle, fascia and tendon of right thigh, subsequent encounter: Secondary | ICD-10-CM | POA: Diagnosis not present

## 2019-02-04 DIAGNOSIS — S76291D Other injury of adductor muscle, fascia and tendon of right thigh, subsequent encounter: Secondary | ICD-10-CM | POA: Diagnosis not present

## 2019-02-05 DIAGNOSIS — N5231 Erectile dysfunction following radical prostatectomy: Secondary | ICD-10-CM | POA: Diagnosis not present

## 2019-02-05 DIAGNOSIS — Z8546 Personal history of malignant neoplasm of prostate: Secondary | ICD-10-CM | POA: Diagnosis not present

## 2019-04-06 DIAGNOSIS — Z125 Encounter for screening for malignant neoplasm of prostate: Secondary | ICD-10-CM | POA: Diagnosis not present

## 2019-04-06 DIAGNOSIS — Z79899 Other long term (current) drug therapy: Secondary | ICD-10-CM | POA: Diagnosis not present

## 2019-04-06 DIAGNOSIS — E7849 Other hyperlipidemia: Secondary | ICD-10-CM | POA: Diagnosis not present

## 2019-04-07 DIAGNOSIS — R82998 Other abnormal findings in urine: Secondary | ICD-10-CM | POA: Diagnosis not present

## 2019-04-12 DIAGNOSIS — Z0000 Encounter for general adult medical examination without abnormal findings: Secondary | ICD-10-CM | POA: Diagnosis not present

## 2019-04-12 DIAGNOSIS — E785 Hyperlipidemia, unspecified: Secondary | ICD-10-CM | POA: Diagnosis not present

## 2019-04-12 DIAGNOSIS — M48061 Spinal stenosis, lumbar region without neurogenic claudication: Secondary | ICD-10-CM | POA: Diagnosis not present

## 2019-04-12 DIAGNOSIS — Z1331 Encounter for screening for depression: Secondary | ICD-10-CM | POA: Diagnosis not present

## 2019-04-12 DIAGNOSIS — K810 Acute cholecystitis: Secondary | ICD-10-CM | POA: Diagnosis not present

## 2019-04-12 DIAGNOSIS — C61 Malignant neoplasm of prostate: Secondary | ICD-10-CM | POA: Diagnosis not present

## 2019-08-23 DIAGNOSIS — L814 Other melanin hyperpigmentation: Secondary | ICD-10-CM | POA: Diagnosis not present

## 2019-08-23 DIAGNOSIS — D1801 Hemangioma of skin and subcutaneous tissue: Secondary | ICD-10-CM | POA: Diagnosis not present

## 2019-08-23 DIAGNOSIS — L821 Other seborrheic keratosis: Secondary | ICD-10-CM | POA: Diagnosis not present

## 2019-08-23 DIAGNOSIS — B351 Tinea unguium: Secondary | ICD-10-CM | POA: Diagnosis not present

## 2019-08-23 DIAGNOSIS — D485 Neoplasm of uncertain behavior of skin: Secondary | ICD-10-CM | POA: Diagnosis not present

## 2019-08-23 DIAGNOSIS — Z85828 Personal history of other malignant neoplasm of skin: Secondary | ICD-10-CM | POA: Diagnosis not present

## 2019-09-04 DIAGNOSIS — Z23 Encounter for immunization: Secondary | ICD-10-CM | POA: Diagnosis not present

## 2019-12-09 DIAGNOSIS — Z23 Encounter for immunization: Secondary | ICD-10-CM | POA: Diagnosis not present

## 2020-01-05 DIAGNOSIS — Z23 Encounter for immunization: Secondary | ICD-10-CM | POA: Diagnosis not present

## 2020-01-19 DIAGNOSIS — Z8546 Personal history of malignant neoplasm of prostate: Secondary | ICD-10-CM | POA: Diagnosis not present

## 2020-01-28 DIAGNOSIS — N5231 Erectile dysfunction following radical prostatectomy: Secondary | ICD-10-CM | POA: Diagnosis not present

## 2020-01-28 DIAGNOSIS — Z8546 Personal history of malignant neoplasm of prostate: Secondary | ICD-10-CM | POA: Diagnosis not present

## 2020-04-19 ENCOUNTER — Other Ambulatory Visit (HOSPITAL_COMMUNITY): Payer: Self-pay | Admitting: Internal Medicine

## 2020-04-19 ENCOUNTER — Other Ambulatory Visit: Payer: Self-pay | Admitting: Internal Medicine

## 2020-04-19 DIAGNOSIS — G2581 Restless legs syndrome: Secondary | ICD-10-CM | POA: Diagnosis not present

## 2020-04-19 DIAGNOSIS — M48061 Spinal stenosis, lumbar region without neurogenic claudication: Secondary | ICD-10-CM | POA: Diagnosis not present

## 2020-04-19 DIAGNOSIS — R269 Unspecified abnormalities of gait and mobility: Secondary | ICD-10-CM | POA: Diagnosis not present

## 2020-04-19 DIAGNOSIS — E785 Hyperlipidemia, unspecified: Secondary | ICD-10-CM | POA: Diagnosis not present

## 2020-04-19 DIAGNOSIS — C61 Malignant neoplasm of prostate: Secondary | ICD-10-CM | POA: Diagnosis not present

## 2020-04-19 DIAGNOSIS — F419 Anxiety disorder, unspecified: Secondary | ICD-10-CM | POA: Diagnosis not present

## 2020-04-19 DIAGNOSIS — I493 Ventricular premature depolarization: Secondary | ICD-10-CM | POA: Diagnosis not present

## 2020-04-20 ENCOUNTER — Ambulatory Visit (HOSPITAL_COMMUNITY)
Admission: RE | Admit: 2020-04-20 | Discharge: 2020-04-20 | Disposition: A | Discharge status: Home / Self Care | Payer: Medicare Other | Source: Ambulatory Visit | Attending: Internal Medicine | Admitting: Internal Medicine

## 2020-04-20 ENCOUNTER — Other Ambulatory Visit: Payer: Self-pay

## 2020-04-20 DIAGNOSIS — M5126 Other intervertebral disc displacement, lumbar region: Secondary | ICD-10-CM | POA: Diagnosis not present

## 2020-04-20 DIAGNOSIS — M48061 Spinal stenosis, lumbar region without neurogenic claudication: Secondary | ICD-10-CM | POA: Insufficient documentation

## 2020-04-28 DIAGNOSIS — E7849 Other hyperlipidemia: Secondary | ICD-10-CM | POA: Diagnosis not present

## 2020-04-28 DIAGNOSIS — Z125 Encounter for screening for malignant neoplasm of prostate: Secondary | ICD-10-CM | POA: Diagnosis not present

## 2020-05-02 DIAGNOSIS — Z1212 Encounter for screening for malignant neoplasm of rectum: Secondary | ICD-10-CM | POA: Diagnosis not present

## 2020-05-02 DIAGNOSIS — R82998 Other abnormal findings in urine: Secondary | ICD-10-CM | POA: Diagnosis not present

## 2020-05-05 DIAGNOSIS — I493 Ventricular premature depolarization: Secondary | ICD-10-CM | POA: Diagnosis not present

## 2020-05-05 DIAGNOSIS — E785 Hyperlipidemia, unspecified: Secondary | ICD-10-CM | POA: Diagnosis not present

## 2020-05-05 DIAGNOSIS — C61 Malignant neoplasm of prostate: Secondary | ICD-10-CM | POA: Diagnosis not present

## 2020-05-05 DIAGNOSIS — R3121 Asymptomatic microscopic hematuria: Secondary | ICD-10-CM | POA: Diagnosis not present

## 2020-05-05 DIAGNOSIS — F419 Anxiety disorder, unspecified: Secondary | ICD-10-CM | POA: Diagnosis not present

## 2020-05-05 DIAGNOSIS — M48061 Spinal stenosis, lumbar region without neurogenic claudication: Secondary | ICD-10-CM | POA: Diagnosis not present

## 2020-05-05 DIAGNOSIS — Z0000 Encounter for general adult medical examination without abnormal findings: Secondary | ICD-10-CM | POA: Diagnosis not present

## 2020-05-05 DIAGNOSIS — R269 Unspecified abnormalities of gait and mobility: Secondary | ICD-10-CM | POA: Diagnosis not present

## 2020-05-05 DIAGNOSIS — Z1331 Encounter for screening for depression: Secondary | ICD-10-CM | POA: Diagnosis not present

## 2020-05-05 DIAGNOSIS — G2581 Restless legs syndrome: Secondary | ICD-10-CM | POA: Diagnosis not present

## 2020-05-09 DIAGNOSIS — M48061 Spinal stenosis, lumbar region without neurogenic claudication: Secondary | ICD-10-CM | POA: Diagnosis not present

## 2020-05-09 DIAGNOSIS — M25551 Pain in right hip: Secondary | ICD-10-CM | POA: Diagnosis not present

## 2020-05-09 DIAGNOSIS — R269 Unspecified abnormalities of gait and mobility: Secondary | ICD-10-CM | POA: Diagnosis not present

## 2020-05-09 DIAGNOSIS — R1031 Right lower quadrant pain: Secondary | ICD-10-CM | POA: Diagnosis not present

## 2020-06-21 DIAGNOSIS — Z8546 Personal history of malignant neoplasm of prostate: Secondary | ICD-10-CM | POA: Diagnosis not present

## 2020-06-21 DIAGNOSIS — R3121 Asymptomatic microscopic hematuria: Secondary | ICD-10-CM | POA: Diagnosis not present

## 2020-06-28 DIAGNOSIS — M4802 Spinal stenosis, cervical region: Secondary | ICD-10-CM | POA: Diagnosis not present

## 2020-06-28 DIAGNOSIS — M47816 Spondylosis without myelopathy or radiculopathy, lumbar region: Secondary | ICD-10-CM | POA: Diagnosis not present

## 2020-06-28 DIAGNOSIS — M62838 Other muscle spasm: Secondary | ICD-10-CM | POA: Diagnosis not present

## 2020-06-28 DIAGNOSIS — M1611 Unilateral primary osteoarthritis, right hip: Secondary | ICD-10-CM | POA: Diagnosis not present

## 2020-07-11 DIAGNOSIS — M47812 Spondylosis without myelopathy or radiculopathy, cervical region: Secondary | ICD-10-CM | POA: Diagnosis not present

## 2020-07-11 DIAGNOSIS — M47814 Spondylosis without myelopathy or radiculopathy, thoracic region: Secondary | ICD-10-CM | POA: Diagnosis not present

## 2020-07-11 DIAGNOSIS — M4802 Spinal stenosis, cervical region: Secondary | ICD-10-CM | POA: Diagnosis not present

## 2020-07-11 DIAGNOSIS — G992 Myelopathy in diseases classified elsewhere: Secondary | ICD-10-CM | POA: Diagnosis not present

## 2020-07-11 DIAGNOSIS — M47816 Spondylosis without myelopathy or radiculopathy, lumbar region: Secondary | ICD-10-CM | POA: Diagnosis not present

## 2020-07-11 DIAGNOSIS — M4804 Spinal stenosis, thoracic region: Secondary | ICD-10-CM | POA: Diagnosis not present

## 2020-07-11 DIAGNOSIS — M2578 Osteophyte, vertebrae: Secondary | ICD-10-CM | POA: Diagnosis not present

## 2020-07-11 DIAGNOSIS — M62838 Other muscle spasm: Secondary | ICD-10-CM | POA: Diagnosis not present

## 2020-08-02 DIAGNOSIS — M47812 Spondylosis without myelopathy or radiculopathy, cervical region: Secondary | ICD-10-CM | POA: Diagnosis not present

## 2020-08-15 DIAGNOSIS — M47812 Spondylosis without myelopathy or radiculopathy, cervical region: Secondary | ICD-10-CM | POA: Diagnosis not present

## 2020-08-15 DIAGNOSIS — Z01818 Encounter for other preprocedural examination: Secondary | ICD-10-CM | POA: Diagnosis not present

## 2020-08-15 DIAGNOSIS — Z8659 Personal history of other mental and behavioral disorders: Secondary | ICD-10-CM | POA: Diagnosis not present

## 2020-08-24 DIAGNOSIS — D2262 Melanocytic nevi of left upper limb, including shoulder: Secondary | ICD-10-CM | POA: Diagnosis not present

## 2020-08-24 DIAGNOSIS — L821 Other seborrheic keratosis: Secondary | ICD-10-CM | POA: Diagnosis not present

## 2020-08-24 DIAGNOSIS — B351 Tinea unguium: Secondary | ICD-10-CM | POA: Diagnosis not present

## 2020-08-24 DIAGNOSIS — D225 Melanocytic nevi of trunk: Secondary | ICD-10-CM | POA: Diagnosis not present

## 2020-08-24 DIAGNOSIS — D2261 Melanocytic nevi of right upper limb, including shoulder: Secondary | ICD-10-CM | POA: Diagnosis not present

## 2020-08-24 DIAGNOSIS — Z85828 Personal history of other malignant neoplasm of skin: Secondary | ICD-10-CM | POA: Diagnosis not present

## 2020-08-24 DIAGNOSIS — D1801 Hemangioma of skin and subcutaneous tissue: Secondary | ICD-10-CM | POA: Diagnosis not present

## 2020-08-24 DIAGNOSIS — M7981 Nontraumatic hematoma of soft tissue: Secondary | ICD-10-CM | POA: Diagnosis not present

## 2020-09-02 DIAGNOSIS — Z01818 Encounter for other preprocedural examination: Secondary | ICD-10-CM | POA: Diagnosis not present

## 2020-09-02 DIAGNOSIS — Z20822 Contact with and (suspected) exposure to covid-19: Secondary | ICD-10-CM | POA: Diagnosis not present

## 2020-09-06 DIAGNOSIS — Z01818 Encounter for other preprocedural examination: Secondary | ICD-10-CM | POA: Diagnosis not present

## 2020-09-06 DIAGNOSIS — Z20822 Contact with and (suspected) exposure to covid-19: Secondary | ICD-10-CM | POA: Diagnosis not present

## 2020-09-08 DIAGNOSIS — Z9049 Acquired absence of other specified parts of digestive tract: Secondary | ICD-10-CM | POA: Diagnosis not present

## 2020-09-08 DIAGNOSIS — M4722 Other spondylosis with radiculopathy, cervical region: Secondary | ICD-10-CM | POA: Diagnosis not present

## 2020-09-08 DIAGNOSIS — M4712 Other spondylosis with myelopathy, cervical region: Secondary | ICD-10-CM | POA: Diagnosis not present

## 2020-09-08 DIAGNOSIS — Z8546 Personal history of malignant neoplasm of prostate: Secondary | ICD-10-CM | POA: Diagnosis not present

## 2020-09-08 DIAGNOSIS — Z79899 Other long term (current) drug therapy: Secondary | ICD-10-CM | POA: Diagnosis not present

## 2020-09-08 DIAGNOSIS — E785 Hyperlipidemia, unspecified: Secondary | ICD-10-CM | POA: Diagnosis not present

## 2020-09-08 DIAGNOSIS — M4802 Spinal stenosis, cervical region: Secondary | ICD-10-CM | POA: Diagnosis not present

## 2020-09-08 DIAGNOSIS — M5412 Radiculopathy, cervical region: Secondary | ICD-10-CM | POA: Diagnosis not present

## 2020-09-08 DIAGNOSIS — Z7982 Long term (current) use of aspirin: Secondary | ICD-10-CM | POA: Diagnosis not present

## 2020-09-08 DIAGNOSIS — M2578 Osteophyte, vertebrae: Secondary | ICD-10-CM | POA: Diagnosis not present

## 2020-09-09 DIAGNOSIS — E785 Hyperlipidemia, unspecified: Secondary | ICD-10-CM | POA: Diagnosis not present

## 2020-09-09 DIAGNOSIS — M5412 Radiculopathy, cervical region: Secondary | ICD-10-CM | POA: Diagnosis not present

## 2020-09-09 DIAGNOSIS — M4312 Spondylolisthesis, cervical region: Secondary | ICD-10-CM | POA: Diagnosis not present

## 2020-09-09 DIAGNOSIS — M4712 Other spondylosis with myelopathy, cervical region: Secondary | ICD-10-CM | POA: Diagnosis not present

## 2020-09-09 DIAGNOSIS — M4802 Spinal stenosis, cervical region: Secondary | ICD-10-CM | POA: Diagnosis not present

## 2020-09-09 DIAGNOSIS — Z8546 Personal history of malignant neoplasm of prostate: Secondary | ICD-10-CM | POA: Diagnosis not present

## 2020-09-09 DIAGNOSIS — M2578 Osteophyte, vertebrae: Secondary | ICD-10-CM | POA: Diagnosis not present

## 2020-09-09 DIAGNOSIS — Z981 Arthrodesis status: Secondary | ICD-10-CM | POA: Diagnosis not present

## 2020-09-09 DIAGNOSIS — M47812 Spondylosis without myelopathy or radiculopathy, cervical region: Secondary | ICD-10-CM | POA: Diagnosis not present

## 2020-09-16 DIAGNOSIS — Z23 Encounter for immunization: Secondary | ICD-10-CM | POA: Diagnosis not present

## 2020-09-20 DIAGNOSIS — Z4889 Encounter for other specified surgical aftercare: Secondary | ICD-10-CM | POA: Diagnosis not present

## 2020-10-09 DIAGNOSIS — Z23 Encounter for immunization: Secondary | ICD-10-CM | POA: Diagnosis not present

## 2020-10-11 DIAGNOSIS — Z4789 Encounter for other orthopedic aftercare: Secondary | ICD-10-CM | POA: Diagnosis not present

## 2020-11-01 DIAGNOSIS — M47812 Spondylosis without myelopathy or radiculopathy, cervical region: Secondary | ICD-10-CM | POA: Diagnosis not present

## 2020-11-06 DIAGNOSIS — M542 Cervicalgia: Secondary | ICD-10-CM | POA: Diagnosis not present

## 2020-11-06 DIAGNOSIS — R269 Unspecified abnormalities of gait and mobility: Secondary | ICD-10-CM | POA: Diagnosis not present

## 2020-11-06 DIAGNOSIS — M48061 Spinal stenosis, lumbar region without neurogenic claudication: Secondary | ICD-10-CM | POA: Diagnosis not present

## 2020-11-15 DIAGNOSIS — M48061 Spinal stenosis, lumbar region without neurogenic claudication: Secondary | ICD-10-CM | POA: Diagnosis not present

## 2020-11-15 DIAGNOSIS — R269 Unspecified abnormalities of gait and mobility: Secondary | ICD-10-CM | POA: Diagnosis not present

## 2020-11-15 DIAGNOSIS — M542 Cervicalgia: Secondary | ICD-10-CM | POA: Diagnosis not present

## 2020-11-29 DIAGNOSIS — M48061 Spinal stenosis, lumbar region without neurogenic claudication: Secondary | ICD-10-CM | POA: Diagnosis not present

## 2020-11-29 DIAGNOSIS — R269 Unspecified abnormalities of gait and mobility: Secondary | ICD-10-CM | POA: Diagnosis not present

## 2020-11-29 DIAGNOSIS — M542 Cervicalgia: Secondary | ICD-10-CM | POA: Diagnosis not present

## 2020-12-13 DIAGNOSIS — R269 Unspecified abnormalities of gait and mobility: Secondary | ICD-10-CM | POA: Diagnosis not present

## 2020-12-13 DIAGNOSIS — M48061 Spinal stenosis, lumbar region without neurogenic claudication: Secondary | ICD-10-CM | POA: Diagnosis not present

## 2020-12-13 DIAGNOSIS — M542 Cervicalgia: Secondary | ICD-10-CM | POA: Diagnosis not present

## 2020-12-19 DIAGNOSIS — R269 Unspecified abnormalities of gait and mobility: Secondary | ICD-10-CM | POA: Diagnosis not present

## 2020-12-19 DIAGNOSIS — M48061 Spinal stenosis, lumbar region without neurogenic claudication: Secondary | ICD-10-CM | POA: Diagnosis not present

## 2020-12-19 DIAGNOSIS — M542 Cervicalgia: Secondary | ICD-10-CM | POA: Diagnosis not present

## 2020-12-20 DIAGNOSIS — M48061 Spinal stenosis, lumbar region without neurogenic claudication: Secondary | ICD-10-CM | POA: Diagnosis not present

## 2020-12-20 DIAGNOSIS — M47812 Spondylosis without myelopathy or radiculopathy, cervical region: Secondary | ICD-10-CM | POA: Diagnosis not present

## 2020-12-20 DIAGNOSIS — M5126 Other intervertebral disc displacement, lumbar region: Secondary | ICD-10-CM | POA: Diagnosis not present

## 2020-12-20 DIAGNOSIS — R29898 Other symptoms and signs involving the musculoskeletal system: Secondary | ICD-10-CM | POA: Diagnosis not present

## 2020-12-20 DIAGNOSIS — M4322 Fusion of spine, cervical region: Secondary | ICD-10-CM | POA: Diagnosis not present

## 2020-12-20 DIAGNOSIS — M5136 Other intervertebral disc degeneration, lumbar region: Secondary | ICD-10-CM | POA: Diagnosis not present

## 2020-12-26 DIAGNOSIS — M48061 Spinal stenosis, lumbar region without neurogenic claudication: Secondary | ICD-10-CM | POA: Diagnosis not present

## 2020-12-26 DIAGNOSIS — M542 Cervicalgia: Secondary | ICD-10-CM | POA: Diagnosis not present

## 2020-12-26 DIAGNOSIS — R269 Unspecified abnormalities of gait and mobility: Secondary | ICD-10-CM | POA: Diagnosis not present

## 2021-01-02 DIAGNOSIS — R269 Unspecified abnormalities of gait and mobility: Secondary | ICD-10-CM | POA: Diagnosis not present

## 2021-01-02 DIAGNOSIS — M48061 Spinal stenosis, lumbar region without neurogenic claudication: Secondary | ICD-10-CM | POA: Diagnosis not present

## 2021-01-02 DIAGNOSIS — M542 Cervicalgia: Secondary | ICD-10-CM | POA: Diagnosis not present

## 2021-01-03 DIAGNOSIS — Z8546 Personal history of malignant neoplasm of prostate: Secondary | ICD-10-CM | POA: Diagnosis not present

## 2021-01-10 DIAGNOSIS — R3121 Asymptomatic microscopic hematuria: Secondary | ICD-10-CM | POA: Diagnosis not present

## 2021-01-10 DIAGNOSIS — Z8546 Personal history of malignant neoplasm of prostate: Secondary | ICD-10-CM | POA: Diagnosis not present

## 2021-01-17 DIAGNOSIS — M542 Cervicalgia: Secondary | ICD-10-CM | POA: Diagnosis not present

## 2021-01-17 DIAGNOSIS — M48061 Spinal stenosis, lumbar region without neurogenic claudication: Secondary | ICD-10-CM | POA: Diagnosis not present

## 2021-01-17 DIAGNOSIS — R269 Unspecified abnormalities of gait and mobility: Secondary | ICD-10-CM | POA: Diagnosis not present

## 2021-01-24 DIAGNOSIS — M48061 Spinal stenosis, lumbar region without neurogenic claudication: Secondary | ICD-10-CM | POA: Diagnosis not present

## 2021-01-24 DIAGNOSIS — R269 Unspecified abnormalities of gait and mobility: Secondary | ICD-10-CM | POA: Diagnosis not present

## 2021-01-24 DIAGNOSIS — M542 Cervicalgia: Secondary | ICD-10-CM | POA: Diagnosis not present

## 2021-02-01 DIAGNOSIS — R269 Unspecified abnormalities of gait and mobility: Secondary | ICD-10-CM | POA: Diagnosis not present

## 2021-02-01 DIAGNOSIS — M542 Cervicalgia: Secondary | ICD-10-CM | POA: Diagnosis not present

## 2021-02-01 DIAGNOSIS — M48061 Spinal stenosis, lumbar region without neurogenic claudication: Secondary | ICD-10-CM | POA: Diagnosis not present

## 2021-02-07 DIAGNOSIS — R269 Unspecified abnormalities of gait and mobility: Secondary | ICD-10-CM | POA: Diagnosis not present

## 2021-02-07 DIAGNOSIS — M542 Cervicalgia: Secondary | ICD-10-CM | POA: Diagnosis not present

## 2021-02-07 DIAGNOSIS — M48061 Spinal stenosis, lumbar region without neurogenic claudication: Secondary | ICD-10-CM | POA: Diagnosis not present

## 2021-02-12 DIAGNOSIS — G5731 Lesion of lateral popliteal nerve, right lower limb: Secondary | ICD-10-CM | POA: Diagnosis not present

## 2021-02-26 DIAGNOSIS — R269 Unspecified abnormalities of gait and mobility: Secondary | ICD-10-CM | POA: Diagnosis not present

## 2021-02-26 DIAGNOSIS — M48061 Spinal stenosis, lumbar region without neurogenic claudication: Secondary | ICD-10-CM | POA: Diagnosis not present

## 2021-02-26 DIAGNOSIS — M542 Cervicalgia: Secondary | ICD-10-CM | POA: Diagnosis not present

## 2021-03-14 DIAGNOSIS — Z981 Arthrodesis status: Secondary | ICD-10-CM | POA: Diagnosis not present

## 2021-03-14 DIAGNOSIS — R29898 Other symptoms and signs involving the musculoskeletal system: Secondary | ICD-10-CM | POA: Diagnosis not present

## 2021-03-14 DIAGNOSIS — M47812 Spondylosis without myelopathy or radiculopathy, cervical region: Secondary | ICD-10-CM | POA: Diagnosis not present

## 2021-03-14 DIAGNOSIS — M4322 Fusion of spine, cervical region: Secondary | ICD-10-CM | POA: Diagnosis not present

## 2021-03-21 DIAGNOSIS — G5731 Lesion of lateral popliteal nerve, right lower limb: Secondary | ICD-10-CM | POA: Diagnosis not present

## 2021-03-26 DIAGNOSIS — M542 Cervicalgia: Secondary | ICD-10-CM | POA: Diagnosis not present

## 2021-03-26 DIAGNOSIS — R269 Unspecified abnormalities of gait and mobility: Secondary | ICD-10-CM | POA: Diagnosis not present

## 2021-03-26 DIAGNOSIS — M48061 Spinal stenosis, lumbar region without neurogenic claudication: Secondary | ICD-10-CM | POA: Diagnosis not present

## 2021-04-03 DIAGNOSIS — R269 Unspecified abnormalities of gait and mobility: Secondary | ICD-10-CM | POA: Diagnosis not present

## 2021-04-03 DIAGNOSIS — M48061 Spinal stenosis, lumbar region without neurogenic claudication: Secondary | ICD-10-CM | POA: Diagnosis not present

## 2021-04-03 DIAGNOSIS — M542 Cervicalgia: Secondary | ICD-10-CM | POA: Diagnosis not present

## 2021-04-17 DIAGNOSIS — M48061 Spinal stenosis, lumbar region without neurogenic claudication: Secondary | ICD-10-CM | POA: Diagnosis not present

## 2021-04-17 DIAGNOSIS — R269 Unspecified abnormalities of gait and mobility: Secondary | ICD-10-CM | POA: Diagnosis not present

## 2021-04-17 DIAGNOSIS — M542 Cervicalgia: Secondary | ICD-10-CM | POA: Diagnosis not present

## 2021-04-20 DIAGNOSIS — H2513 Age-related nuclear cataract, bilateral: Secondary | ICD-10-CM | POA: Diagnosis not present

## 2021-04-20 DIAGNOSIS — H5203 Hypermetropia, bilateral: Secondary | ICD-10-CM | POA: Diagnosis not present

## 2021-04-25 DIAGNOSIS — M542 Cervicalgia: Secondary | ICD-10-CM | POA: Diagnosis not present

## 2021-04-25 DIAGNOSIS — R269 Unspecified abnormalities of gait and mobility: Secondary | ICD-10-CM | POA: Diagnosis not present

## 2021-04-25 DIAGNOSIS — M48061 Spinal stenosis, lumbar region without neurogenic claudication: Secondary | ICD-10-CM | POA: Diagnosis not present

## 2021-05-02 DIAGNOSIS — M542 Cervicalgia: Secondary | ICD-10-CM | POA: Diagnosis not present

## 2021-05-02 DIAGNOSIS — M48061 Spinal stenosis, lumbar region without neurogenic claudication: Secondary | ICD-10-CM | POA: Diagnosis not present

## 2021-05-02 DIAGNOSIS — R269 Unspecified abnormalities of gait and mobility: Secondary | ICD-10-CM | POA: Diagnosis not present

## 2021-05-04 DIAGNOSIS — Z125 Encounter for screening for malignant neoplasm of prostate: Secondary | ICD-10-CM | POA: Diagnosis not present

## 2021-05-04 DIAGNOSIS — Z0000 Encounter for general adult medical examination without abnormal findings: Secondary | ICD-10-CM | POA: Diagnosis not present

## 2021-05-04 DIAGNOSIS — E785 Hyperlipidemia, unspecified: Secondary | ICD-10-CM | POA: Diagnosis not present

## 2021-05-10 DIAGNOSIS — M48061 Spinal stenosis, lumbar region without neurogenic claudication: Secondary | ICD-10-CM | POA: Diagnosis not present

## 2021-05-10 DIAGNOSIS — R269 Unspecified abnormalities of gait and mobility: Secondary | ICD-10-CM | POA: Diagnosis not present

## 2021-05-10 DIAGNOSIS — M542 Cervicalgia: Secondary | ICD-10-CM | POA: Diagnosis not present

## 2021-05-11 DIAGNOSIS — E785 Hyperlipidemia, unspecified: Secondary | ICD-10-CM | POA: Diagnosis not present

## 2021-05-11 DIAGNOSIS — G2581 Restless legs syndrome: Secondary | ICD-10-CM | POA: Diagnosis not present

## 2021-05-11 DIAGNOSIS — R3121 Asymptomatic microscopic hematuria: Secondary | ICD-10-CM | POA: Diagnosis not present

## 2021-05-11 DIAGNOSIS — Z0000 Encounter for general adult medical examination without abnormal findings: Secondary | ICD-10-CM | POA: Diagnosis not present

## 2021-05-11 DIAGNOSIS — F419 Anxiety disorder, unspecified: Secondary | ICD-10-CM | POA: Diagnosis not present

## 2021-05-11 DIAGNOSIS — M48061 Spinal stenosis, lumbar region without neurogenic claudication: Secondary | ICD-10-CM | POA: Diagnosis not present

## 2021-05-11 DIAGNOSIS — Z1389 Encounter for screening for other disorder: Secondary | ICD-10-CM | POA: Diagnosis not present

## 2021-05-11 DIAGNOSIS — Z23 Encounter for immunization: Secondary | ICD-10-CM | POA: Diagnosis not present

## 2021-05-11 DIAGNOSIS — R82998 Other abnormal findings in urine: Secondary | ICD-10-CM | POA: Diagnosis not present

## 2021-05-11 DIAGNOSIS — C61 Malignant neoplasm of prostate: Secondary | ICD-10-CM | POA: Diagnosis not present

## 2021-05-11 DIAGNOSIS — R269 Unspecified abnormalities of gait and mobility: Secondary | ICD-10-CM | POA: Diagnosis not present

## 2021-05-11 DIAGNOSIS — Z1212 Encounter for screening for malignant neoplasm of rectum: Secondary | ICD-10-CM | POA: Diagnosis not present

## 2021-05-22 DIAGNOSIS — M542 Cervicalgia: Secondary | ICD-10-CM | POA: Diagnosis not present

## 2021-05-22 DIAGNOSIS — M48061 Spinal stenosis, lumbar region without neurogenic claudication: Secondary | ICD-10-CM | POA: Diagnosis not present

## 2021-05-22 DIAGNOSIS — R269 Unspecified abnormalities of gait and mobility: Secondary | ICD-10-CM | POA: Diagnosis not present

## 2021-06-06 DIAGNOSIS — M542 Cervicalgia: Secondary | ICD-10-CM | POA: Diagnosis not present

## 2021-06-06 DIAGNOSIS — M48061 Spinal stenosis, lumbar region without neurogenic claudication: Secondary | ICD-10-CM | POA: Diagnosis not present

## 2021-06-06 DIAGNOSIS — R269 Unspecified abnormalities of gait and mobility: Secondary | ICD-10-CM | POA: Diagnosis not present

## 2021-06-11 DIAGNOSIS — Z23 Encounter for immunization: Secondary | ICD-10-CM | POA: Diagnosis not present

## 2021-06-20 DIAGNOSIS — M542 Cervicalgia: Secondary | ICD-10-CM | POA: Diagnosis not present

## 2021-06-20 DIAGNOSIS — M48061 Spinal stenosis, lumbar region without neurogenic claudication: Secondary | ICD-10-CM | POA: Diagnosis not present

## 2021-06-20 DIAGNOSIS — R269 Unspecified abnormalities of gait and mobility: Secondary | ICD-10-CM | POA: Diagnosis not present

## 2021-06-26 DIAGNOSIS — M1611 Unilateral primary osteoarthritis, right hip: Secondary | ICD-10-CM | POA: Diagnosis not present

## 2021-07-04 DIAGNOSIS — M542 Cervicalgia: Secondary | ICD-10-CM | POA: Diagnosis not present

## 2021-07-04 DIAGNOSIS — R269 Unspecified abnormalities of gait and mobility: Secondary | ICD-10-CM | POA: Diagnosis not present

## 2021-07-04 DIAGNOSIS — M48061 Spinal stenosis, lumbar region without neurogenic claudication: Secondary | ICD-10-CM | POA: Diagnosis not present

## 2021-08-08 DIAGNOSIS — M48061 Spinal stenosis, lumbar region without neurogenic claudication: Secondary | ICD-10-CM | POA: Diagnosis not present

## 2021-08-08 DIAGNOSIS — M542 Cervicalgia: Secondary | ICD-10-CM | POA: Diagnosis not present

## 2021-08-08 DIAGNOSIS — R269 Unspecified abnormalities of gait and mobility: Secondary | ICD-10-CM | POA: Diagnosis not present

## 2021-08-20 DIAGNOSIS — M48061 Spinal stenosis, lumbar region without neurogenic claudication: Secondary | ICD-10-CM | POA: Diagnosis not present

## 2021-08-20 DIAGNOSIS — R269 Unspecified abnormalities of gait and mobility: Secondary | ICD-10-CM | POA: Diagnosis not present

## 2021-08-20 DIAGNOSIS — M542 Cervicalgia: Secondary | ICD-10-CM | POA: Diagnosis not present

## 2021-08-27 DIAGNOSIS — D225 Melanocytic nevi of trunk: Secondary | ICD-10-CM | POA: Diagnosis not present

## 2021-08-27 DIAGNOSIS — L821 Other seborrheic keratosis: Secondary | ICD-10-CM | POA: Diagnosis not present

## 2021-08-27 DIAGNOSIS — Z85828 Personal history of other malignant neoplasm of skin: Secondary | ICD-10-CM | POA: Diagnosis not present

## 2021-08-27 DIAGNOSIS — D1801 Hemangioma of skin and subcutaneous tissue: Secondary | ICD-10-CM | POA: Diagnosis not present

## 2021-09-01 DIAGNOSIS — Z23 Encounter for immunization: Secondary | ICD-10-CM | POA: Diagnosis not present

## 2021-09-05 DIAGNOSIS — M542 Cervicalgia: Secondary | ICD-10-CM | POA: Diagnosis not present

## 2021-09-05 DIAGNOSIS — M48061 Spinal stenosis, lumbar region without neurogenic claudication: Secondary | ICD-10-CM | POA: Diagnosis not present

## 2021-09-05 DIAGNOSIS — R269 Unspecified abnormalities of gait and mobility: Secondary | ICD-10-CM | POA: Diagnosis not present

## 2021-09-11 DIAGNOSIS — M8589 Other specified disorders of bone density and structure, multiple sites: Secondary | ICD-10-CM | POA: Diagnosis not present

## 2021-09-14 DIAGNOSIS — Z23 Encounter for immunization: Secondary | ICD-10-CM | POA: Diagnosis not present

## 2021-09-18 DIAGNOSIS — M542 Cervicalgia: Secondary | ICD-10-CM | POA: Diagnosis not present

## 2021-09-18 DIAGNOSIS — M48061 Spinal stenosis, lumbar region without neurogenic claudication: Secondary | ICD-10-CM | POA: Diagnosis not present

## 2021-09-18 DIAGNOSIS — R269 Unspecified abnormalities of gait and mobility: Secondary | ICD-10-CM | POA: Diagnosis not present

## 2021-09-19 DIAGNOSIS — R29898 Other symptoms and signs involving the musculoskeletal system: Secondary | ICD-10-CM | POA: Diagnosis not present

## 2021-09-19 DIAGNOSIS — M4322 Fusion of spine, cervical region: Secondary | ICD-10-CM | POA: Diagnosis not present

## 2021-09-19 DIAGNOSIS — Z981 Arthrodesis status: Secondary | ICD-10-CM | POA: Diagnosis not present

## 2021-10-02 DIAGNOSIS — M48061 Spinal stenosis, lumbar region without neurogenic claudication: Secondary | ICD-10-CM | POA: Diagnosis not present

## 2021-10-02 DIAGNOSIS — M542 Cervicalgia: Secondary | ICD-10-CM | POA: Diagnosis not present

## 2021-10-02 DIAGNOSIS — R269 Unspecified abnormalities of gait and mobility: Secondary | ICD-10-CM | POA: Diagnosis not present

## 2021-10-17 DIAGNOSIS — M1611 Unilateral primary osteoarthritis, right hip: Secondary | ICD-10-CM | POA: Diagnosis not present

## 2021-10-22 DIAGNOSIS — M19071 Primary osteoarthritis, right ankle and foot: Secondary | ICD-10-CM | POA: Diagnosis not present

## 2021-10-22 DIAGNOSIS — R2689 Other abnormalities of gait and mobility: Secondary | ICD-10-CM | POA: Diagnosis not present

## 2021-10-22 DIAGNOSIS — R29898 Other symptoms and signs involving the musculoskeletal system: Secondary | ICD-10-CM | POA: Diagnosis not present

## 2021-10-24 DIAGNOSIS — M542 Cervicalgia: Secondary | ICD-10-CM | POA: Diagnosis not present

## 2021-10-24 DIAGNOSIS — R269 Unspecified abnormalities of gait and mobility: Secondary | ICD-10-CM | POA: Diagnosis not present

## 2021-10-24 DIAGNOSIS — M48061 Spinal stenosis, lumbar region without neurogenic claudication: Secondary | ICD-10-CM | POA: Diagnosis not present

## 2021-10-25 ENCOUNTER — Other Ambulatory Visit (HOSPITAL_COMMUNITY): Payer: Self-pay

## 2022-01-23 DIAGNOSIS — R29898 Other symptoms and signs involving the musculoskeletal system: Secondary | ICD-10-CM | POA: Diagnosis not present

## 2022-01-23 DIAGNOSIS — R2689 Other abnormalities of gait and mobility: Secondary | ICD-10-CM | POA: Diagnosis not present

## 2022-02-05 DIAGNOSIS — M542 Cervicalgia: Secondary | ICD-10-CM | POA: Diagnosis not present

## 2022-02-05 DIAGNOSIS — M48061 Spinal stenosis, lumbar region without neurogenic claudication: Secondary | ICD-10-CM | POA: Diagnosis not present

## 2022-02-05 DIAGNOSIS — R269 Unspecified abnormalities of gait and mobility: Secondary | ICD-10-CM | POA: Diagnosis not present

## 2022-03-14 DIAGNOSIS — M1611 Unilateral primary osteoarthritis, right hip: Secondary | ICD-10-CM | POA: Diagnosis not present

## 2022-03-14 DIAGNOSIS — M25551 Pain in right hip: Secondary | ICD-10-CM | POA: Diagnosis not present

## 2022-04-30 DIAGNOSIS — H2513 Age-related nuclear cataract, bilateral: Secondary | ICD-10-CM | POA: Diagnosis not present

## 2022-04-30 DIAGNOSIS — H524 Presbyopia: Secondary | ICD-10-CM | POA: Diagnosis not present

## 2022-06-10 DIAGNOSIS — R7989 Other specified abnormal findings of blood chemistry: Secondary | ICD-10-CM | POA: Diagnosis not present

## 2022-06-10 DIAGNOSIS — F419 Anxiety disorder, unspecified: Secondary | ICD-10-CM | POA: Diagnosis not present

## 2022-06-10 DIAGNOSIS — Z125 Encounter for screening for malignant neoplasm of prostate: Secondary | ICD-10-CM | POA: Diagnosis not present

## 2022-06-10 DIAGNOSIS — E785 Hyperlipidemia, unspecified: Secondary | ICD-10-CM | POA: Diagnosis not present

## 2022-06-21 ENCOUNTER — Other Ambulatory Visit: Payer: Self-pay | Admitting: Internal Medicine

## 2022-06-21 DIAGNOSIS — Z23 Encounter for immunization: Secondary | ICD-10-CM | POA: Diagnosis not present

## 2022-06-21 DIAGNOSIS — Z1331 Encounter for screening for depression: Secondary | ICD-10-CM | POA: Diagnosis not present

## 2022-06-21 DIAGNOSIS — E785 Hyperlipidemia, unspecified: Secondary | ICD-10-CM

## 2022-06-21 DIAGNOSIS — F419 Anxiety disorder, unspecified: Secondary | ICD-10-CM | POA: Diagnosis not present

## 2022-06-21 DIAGNOSIS — Z1339 Encounter for screening examination for other mental health and behavioral disorders: Secondary | ICD-10-CM | POA: Diagnosis not present

## 2022-06-21 DIAGNOSIS — R82998 Other abnormal findings in urine: Secondary | ICD-10-CM | POA: Diagnosis not present

## 2022-06-21 DIAGNOSIS — Z0000 Encounter for general adult medical examination without abnormal findings: Secondary | ICD-10-CM | POA: Diagnosis not present

## 2022-06-21 DIAGNOSIS — R3121 Asymptomatic microscopic hematuria: Secondary | ICD-10-CM | POA: Diagnosis not present

## 2022-08-08 DIAGNOSIS — M1611 Unilateral primary osteoarthritis, right hip: Secondary | ICD-10-CM | POA: Diagnosis not present

## 2022-08-28 DIAGNOSIS — L821 Other seborrheic keratosis: Secondary | ICD-10-CM | POA: Diagnosis not present

## 2022-08-28 DIAGNOSIS — D2261 Melanocytic nevi of right upper limb, including shoulder: Secondary | ICD-10-CM | POA: Diagnosis not present

## 2022-08-28 DIAGNOSIS — Z85828 Personal history of other malignant neoplasm of skin: Secondary | ICD-10-CM | POA: Diagnosis not present

## 2022-08-28 DIAGNOSIS — D1801 Hemangioma of skin and subcutaneous tissue: Secondary | ICD-10-CM | POA: Diagnosis not present

## 2022-08-28 DIAGNOSIS — L738 Other specified follicular disorders: Secondary | ICD-10-CM | POA: Diagnosis not present

## 2022-08-28 DIAGNOSIS — D225 Melanocytic nevi of trunk: Secondary | ICD-10-CM | POA: Diagnosis not present

## 2022-09-09 ENCOUNTER — Ambulatory Visit
Admission: RE | Admit: 2022-09-09 | Discharge: 2022-09-09 | Disposition: A | Discharge status: Home / Self Care | Payer: No Typology Code available for payment source | Source: Ambulatory Visit | Attending: Internal Medicine | Admitting: Internal Medicine

## 2022-09-09 DIAGNOSIS — E785 Hyperlipidemia, unspecified: Secondary | ICD-10-CM

## 2022-09-09 DIAGNOSIS — I700 Atherosclerosis of aorta: Secondary | ICD-10-CM | POA: Diagnosis not present

## 2022-09-14 DIAGNOSIS — Z23 Encounter for immunization: Secondary | ICD-10-CM | POA: Diagnosis not present

## 2022-10-02 DIAGNOSIS — G5731 Lesion of lateral popliteal nerve, right lower limb: Secondary | ICD-10-CM | POA: Diagnosis not present

## 2022-10-02 DIAGNOSIS — C61 Malignant neoplasm of prostate: Secondary | ICD-10-CM | POA: Diagnosis not present

## 2022-10-02 DIAGNOSIS — M48061 Spinal stenosis, lumbar region without neurogenic claudication: Secondary | ICD-10-CM | POA: Diagnosis not present

## 2022-10-02 DIAGNOSIS — M1611 Unilateral primary osteoarthritis, right hip: Secondary | ICD-10-CM | POA: Diagnosis not present

## 2022-11-13 DIAGNOSIS — Z981 Arthrodesis status: Secondary | ICD-10-CM | POA: Diagnosis not present

## 2022-11-13 DIAGNOSIS — M48061 Spinal stenosis, lumbar region without neurogenic claudication: Secondary | ICD-10-CM | POA: Diagnosis not present

## 2022-11-13 DIAGNOSIS — M4322 Fusion of spine, cervical region: Secondary | ICD-10-CM | POA: Diagnosis not present

## 2022-11-13 DIAGNOSIS — M47816 Spondylosis without myelopathy or radiculopathy, lumbar region: Secondary | ICD-10-CM | POA: Diagnosis not present

## 2022-11-13 DIAGNOSIS — M5416 Radiculopathy, lumbar region: Secondary | ICD-10-CM | POA: Diagnosis not present

## 2022-11-13 DIAGNOSIS — R269 Unspecified abnormalities of gait and mobility: Secondary | ICD-10-CM | POA: Diagnosis not present

## 2022-11-27 DIAGNOSIS — Z23 Encounter for immunization: Secondary | ICD-10-CM | POA: Diagnosis not present

## 2022-12-31 DIAGNOSIS — M4804 Spinal stenosis, thoracic region: Secondary | ICD-10-CM | POA: Diagnosis not present

## 2022-12-31 DIAGNOSIS — Z981 Arthrodesis status: Secondary | ICD-10-CM | POA: Diagnosis not present

## 2022-12-31 DIAGNOSIS — M5416 Radiculopathy, lumbar region: Secondary | ICD-10-CM | POA: Diagnosis not present

## 2022-12-31 DIAGNOSIS — R269 Unspecified abnormalities of gait and mobility: Secondary | ICD-10-CM | POA: Diagnosis not present

## 2022-12-31 DIAGNOSIS — R260 Ataxic gait: Secondary | ICD-10-CM | POA: Diagnosis not present

## 2022-12-31 DIAGNOSIS — M48061 Spinal stenosis, lumbar region without neurogenic claudication: Secondary | ICD-10-CM | POA: Diagnosis not present

## 2022-12-31 DIAGNOSIS — M5136 Other intervertebral disc degeneration, lumbar region: Secondary | ICD-10-CM | POA: Diagnosis not present

## 2023-03-12 DIAGNOSIS — M9933 Osseous stenosis of neural canal of lumbar region: Secondary | ICD-10-CM | POA: Diagnosis not present

## 2023-03-12 DIAGNOSIS — M4712 Other spondylosis with myelopathy, cervical region: Secondary | ICD-10-CM | POA: Diagnosis not present

## 2023-03-12 DIAGNOSIS — M9932 Osseous stenosis of neural canal of thoracic region: Secondary | ICD-10-CM | POA: Diagnosis not present

## 2023-03-12 DIAGNOSIS — M4322 Fusion of spine, cervical region: Secondary | ICD-10-CM | POA: Diagnosis not present

## 2023-04-09 DIAGNOSIS — M4322 Fusion of spine, cervical region: Secondary | ICD-10-CM | POA: Diagnosis not present

## 2023-04-09 DIAGNOSIS — M4712 Other spondylosis with myelopathy, cervical region: Secondary | ICD-10-CM | POA: Diagnosis not present

## 2023-05-20 DIAGNOSIS — M9933 Osseous stenosis of neural canal of lumbar region: Secondary | ICD-10-CM | POA: Diagnosis not present

## 2023-05-20 DIAGNOSIS — M21371 Foot drop, right foot: Secondary | ICD-10-CM | POA: Diagnosis not present

## 2023-05-20 DIAGNOSIS — M5416 Radiculopathy, lumbar region: Secondary | ICD-10-CM | POA: Diagnosis not present

## 2023-05-23 DIAGNOSIS — H2513 Age-related nuclear cataract, bilateral: Secondary | ICD-10-CM | POA: Diagnosis not present

## 2023-08-04 DIAGNOSIS — R7989 Other specified abnormal findings of blood chemistry: Secondary | ICD-10-CM | POA: Diagnosis not present

## 2023-08-04 DIAGNOSIS — Z1212 Encounter for screening for malignant neoplasm of rectum: Secondary | ICD-10-CM | POA: Diagnosis not present

## 2023-08-04 DIAGNOSIS — C61 Malignant neoplasm of prostate: Secondary | ICD-10-CM | POA: Diagnosis not present

## 2023-08-04 DIAGNOSIS — E785 Hyperlipidemia, unspecified: Secondary | ICD-10-CM | POA: Diagnosis not present

## 2023-08-06 DIAGNOSIS — M4322 Fusion of spine, cervical region: Secondary | ICD-10-CM | POA: Diagnosis not present

## 2023-08-06 DIAGNOSIS — M4712 Other spondylosis with myelopathy, cervical region: Secondary | ICD-10-CM | POA: Diagnosis not present

## 2023-08-06 DIAGNOSIS — M48061 Spinal stenosis, lumbar region without neurogenic claudication: Secondary | ICD-10-CM | POA: Diagnosis not present

## 2023-08-06 DIAGNOSIS — M9932 Osseous stenosis of neural canal of thoracic region: Secondary | ICD-10-CM | POA: Diagnosis not present

## 2023-08-11 DIAGNOSIS — M48061 Spinal stenosis, lumbar region without neurogenic claudication: Secondary | ICD-10-CM | POA: Diagnosis not present

## 2023-08-11 DIAGNOSIS — I2510 Atherosclerotic heart disease of native coronary artery without angina pectoris: Secondary | ICD-10-CM | POA: Diagnosis not present

## 2023-08-11 DIAGNOSIS — I493 Ventricular premature depolarization: Secondary | ICD-10-CM | POA: Diagnosis not present

## 2023-08-11 DIAGNOSIS — F419 Anxiety disorder, unspecified: Secondary | ICD-10-CM | POA: Diagnosis not present

## 2023-08-11 DIAGNOSIS — R269 Unspecified abnormalities of gait and mobility: Secondary | ICD-10-CM | POA: Diagnosis not present

## 2023-08-11 DIAGNOSIS — C61 Malignant neoplasm of prostate: Secondary | ICD-10-CM | POA: Diagnosis not present

## 2023-08-11 DIAGNOSIS — I700 Atherosclerosis of aorta: Secondary | ICD-10-CM | POA: Diagnosis not present

## 2023-08-11 DIAGNOSIS — E785 Hyperlipidemia, unspecified: Secondary | ICD-10-CM | POA: Diagnosis not present

## 2023-08-11 DIAGNOSIS — R82998 Other abnormal findings in urine: Secondary | ICD-10-CM | POA: Diagnosis not present

## 2023-08-11 DIAGNOSIS — Z0000 Encounter for general adult medical examination without abnormal findings: Secondary | ICD-10-CM | POA: Diagnosis not present

## 2023-08-11 DIAGNOSIS — M8580 Other specified disorders of bone density and structure, unspecified site: Secondary | ICD-10-CM | POA: Diagnosis not present

## 2023-08-11 DIAGNOSIS — G2581 Restless legs syndrome: Secondary | ICD-10-CM | POA: Diagnosis not present

## 2023-08-11 DIAGNOSIS — R3121 Asymptomatic microscopic hematuria: Secondary | ICD-10-CM | POA: Diagnosis not present

## 2023-08-12 ENCOUNTER — Other Ambulatory Visit: Payer: Self-pay | Admitting: Internal Medicine

## 2023-08-12 DIAGNOSIS — M8589 Other specified disorders of bone density and structure, multiple sites: Secondary | ICD-10-CM

## 2023-08-19 DIAGNOSIS — M1611 Unilateral primary osteoarthritis, right hip: Secondary | ICD-10-CM | POA: Diagnosis not present

## 2023-08-30 DIAGNOSIS — Z23 Encounter for immunization: Secondary | ICD-10-CM | POA: Diagnosis not present

## 2023-10-07 DIAGNOSIS — R269 Unspecified abnormalities of gait and mobility: Secondary | ICD-10-CM | POA: Diagnosis not present

## 2023-10-07 DIAGNOSIS — M79672 Pain in left foot: Secondary | ICD-10-CM | POA: Diagnosis not present

## 2023-10-07 DIAGNOSIS — M1611 Unilateral primary osteoarthritis, right hip: Secondary | ICD-10-CM | POA: Diagnosis not present

## 2023-10-07 DIAGNOSIS — Z01818 Encounter for other preprocedural examination: Secondary | ICD-10-CM | POA: Diagnosis not present

## 2023-10-07 DIAGNOSIS — M4712 Other spondylosis with myelopathy, cervical region: Secondary | ICD-10-CM | POA: Diagnosis not present

## 2023-10-07 DIAGNOSIS — E559 Vitamin D deficiency, unspecified: Secondary | ICD-10-CM | POA: Diagnosis not present

## 2023-10-07 DIAGNOSIS — R29898 Other symptoms and signs involving the musculoskeletal system: Secondary | ICD-10-CM | POA: Diagnosis not present

## 2023-10-07 DIAGNOSIS — C61 Malignant neoplasm of prostate: Secondary | ICD-10-CM | POA: Diagnosis not present

## 2023-10-07 DIAGNOSIS — M48061 Spinal stenosis, lumbar region without neurogenic claudication: Secondary | ICD-10-CM | POA: Diagnosis not present

## 2023-10-07 DIAGNOSIS — Z8659 Personal history of other mental and behavioral disorders: Secondary | ICD-10-CM | POA: Diagnosis not present

## 2023-10-13 DIAGNOSIS — M4714 Other spondylosis with myelopathy, thoracic region: Secondary | ICD-10-CM | POA: Diagnosis not present

## 2023-10-13 DIAGNOSIS — E559 Vitamin D deficiency, unspecified: Secondary | ICD-10-CM | POA: Diagnosis not present

## 2023-10-13 DIAGNOSIS — M5003 Cervical disc disorder with myelopathy, cervicothoracic region: Secondary | ICD-10-CM | POA: Diagnosis not present

## 2023-10-13 DIAGNOSIS — M4712 Other spondylosis with myelopathy, cervical region: Secondary | ICD-10-CM | POA: Diagnosis not present

## 2023-10-13 DIAGNOSIS — E785 Hyperlipidemia, unspecified: Secondary | ICD-10-CM | POA: Diagnosis not present

## 2023-10-13 DIAGNOSIS — Z9049 Acquired absence of other specified parts of digestive tract: Secondary | ICD-10-CM | POA: Diagnosis not present

## 2023-10-13 DIAGNOSIS — C61 Malignant neoplasm of prostate: Secondary | ICD-10-CM | POA: Diagnosis not present

## 2023-10-13 DIAGNOSIS — M50022 Cervical disc disorder at C5-C6 level with myelopathy: Secondary | ICD-10-CM | POA: Diagnosis not present

## 2023-10-13 DIAGNOSIS — Z981 Arthrodesis status: Secondary | ICD-10-CM | POA: Diagnosis not present

## 2023-10-13 DIAGNOSIS — M4802 Spinal stenosis, cervical region: Secondary | ICD-10-CM | POA: Diagnosis not present

## 2023-10-13 DIAGNOSIS — M1611 Unilateral primary osteoarthritis, right hip: Secondary | ICD-10-CM | POA: Diagnosis not present

## 2023-10-13 DIAGNOSIS — M21371 Foot drop, right foot: Secondary | ICD-10-CM | POA: Diagnosis not present

## 2023-10-13 DIAGNOSIS — M4804 Spinal stenosis, thoracic region: Secondary | ICD-10-CM | POA: Diagnosis not present

## 2023-10-13 DIAGNOSIS — M50023 Cervical disc disorder at C6-C7 level with myelopathy: Secondary | ICD-10-CM | POA: Diagnosis not present

## 2023-10-13 DIAGNOSIS — M48061 Spinal stenosis, lumbar region without neurogenic claudication: Secondary | ICD-10-CM | POA: Diagnosis not present

## 2023-10-13 DIAGNOSIS — M4803 Spinal stenosis, cervicothoracic region: Secondary | ICD-10-CM | POA: Diagnosis not present

## 2023-10-13 DIAGNOSIS — Z8546 Personal history of malignant neoplasm of prostate: Secondary | ICD-10-CM | POA: Diagnosis not present

## 2023-10-13 DIAGNOSIS — Z8659 Personal history of other mental and behavioral disorders: Secondary | ICD-10-CM | POA: Diagnosis not present

## 2023-10-28 DIAGNOSIS — Z4889 Encounter for other specified surgical aftercare: Secondary | ICD-10-CM | POA: Diagnosis not present

## 2023-10-28 DIAGNOSIS — Z4802 Encounter for removal of sutures: Secondary | ICD-10-CM | POA: Diagnosis not present

## 2023-11-09 DIAGNOSIS — M4712 Other spondylosis with myelopathy, cervical region: Secondary | ICD-10-CM | POA: Diagnosis not present

## 2023-11-09 DIAGNOSIS — T84226A Displacement of internal fixation device of vertebrae, initial encounter: Secondary | ICD-10-CM | POA: Diagnosis not present

## 2023-11-09 DIAGNOSIS — M4622 Osteomyelitis of vertebra, cervical region: Secondary | ICD-10-CM | POA: Diagnosis not present

## 2023-11-09 DIAGNOSIS — T8131XA Disruption of external operation (surgical) wound, not elsewhere classified, initial encounter: Secondary | ICD-10-CM | POA: Diagnosis not present

## 2023-11-09 DIAGNOSIS — B9689 Other specified bacterial agents as the cause of diseases classified elsewhere: Secondary | ICD-10-CM | POA: Diagnosis not present

## 2023-11-09 DIAGNOSIS — T8189XA Other complications of procedures, not elsewhere classified, initial encounter: Secondary | ICD-10-CM | POA: Diagnosis not present

## 2023-11-09 DIAGNOSIS — T8142XA Infection following a procedure, deep incisional surgical site, initial encounter: Secondary | ICD-10-CM | POA: Diagnosis not present

## 2023-11-09 DIAGNOSIS — M4633 Infection of intervertebral disc (pyogenic), cervicothoracic region: Secondary | ICD-10-CM | POA: Diagnosis not present

## 2023-11-09 DIAGNOSIS — M5030 Other cervical disc degeneration, unspecified cervical region: Secondary | ICD-10-CM | POA: Diagnosis not present

## 2023-11-09 DIAGNOSIS — L0211 Cutaneous abscess of neck: Secondary | ICD-10-CM | POA: Diagnosis not present

## 2023-11-09 DIAGNOSIS — T8130XA Disruption of wound, unspecified, initial encounter: Secondary | ICD-10-CM | POA: Diagnosis not present

## 2023-11-09 DIAGNOSIS — T81329A Deep disruption or dehiscence of operation wound, unspecified, initial encounter: Secondary | ICD-10-CM | POA: Diagnosis not present

## 2023-11-09 DIAGNOSIS — M4713 Other spondylosis with myelopathy, cervicothoracic region: Secondary | ICD-10-CM | POA: Diagnosis not present

## 2023-11-09 DIAGNOSIS — M47812 Spondylosis without myelopathy or radiculopathy, cervical region: Secondary | ICD-10-CM | POA: Diagnosis not present

## 2023-11-09 DIAGNOSIS — Z981 Arthrodesis status: Secondary | ICD-10-CM | POA: Diagnosis not present

## 2023-11-15 DIAGNOSIS — Z7983 Long term (current) use of bisphosphonates: Secondary | ICD-10-CM | POA: Diagnosis not present

## 2023-11-15 DIAGNOSIS — F32A Depression, unspecified: Secondary | ICD-10-CM | POA: Diagnosis not present

## 2023-11-15 DIAGNOSIS — Z85828 Personal history of other malignant neoplasm of skin: Secondary | ICD-10-CM | POA: Diagnosis not present

## 2023-11-15 DIAGNOSIS — M48061 Spinal stenosis, lumbar region without neurogenic claudication: Secondary | ICD-10-CM | POA: Diagnosis not present

## 2023-11-15 DIAGNOSIS — E785 Hyperlipidemia, unspecified: Secondary | ICD-10-CM | POA: Diagnosis not present

## 2023-11-15 DIAGNOSIS — T8131XD Disruption of external operation (surgical) wound, not elsewhere classified, subsequent encounter: Secondary | ICD-10-CM | POA: Diagnosis not present

## 2023-11-15 DIAGNOSIS — F419 Anxiety disorder, unspecified: Secondary | ICD-10-CM | POA: Diagnosis not present

## 2023-11-15 DIAGNOSIS — Z8546 Personal history of malignant neoplasm of prostate: Secondary | ICD-10-CM | POA: Diagnosis not present

## 2023-11-15 DIAGNOSIS — Z792 Long term (current) use of antibiotics: Secondary | ICD-10-CM | POA: Diagnosis not present

## 2023-11-15 DIAGNOSIS — E559 Vitamin D deficiency, unspecified: Secondary | ICD-10-CM | POA: Diagnosis not present

## 2023-11-15 DIAGNOSIS — M4726 Other spondylosis with radiculopathy, lumbar region: Secondary | ICD-10-CM | POA: Diagnosis not present

## 2023-11-15 DIAGNOSIS — M1611 Unilateral primary osteoarthritis, right hip: Secondary | ICD-10-CM | POA: Diagnosis not present

## 2023-11-15 DIAGNOSIS — Z452 Encounter for adjustment and management of vascular access device: Secondary | ICD-10-CM | POA: Diagnosis not present

## 2023-11-15 DIAGNOSIS — T8149XA Infection following a procedure, other surgical site, initial encounter: Secondary | ICD-10-CM | POA: Diagnosis not present

## 2023-11-15 DIAGNOSIS — G5731 Lesion of lateral popliteal nerve, right lower limb: Secondary | ICD-10-CM | POA: Diagnosis not present

## 2023-11-15 DIAGNOSIS — M21379 Foot drop, unspecified foot: Secondary | ICD-10-CM | POA: Diagnosis not present

## 2023-11-15 DIAGNOSIS — Z9181 History of falling: Secondary | ICD-10-CM | POA: Diagnosis not present

## 2023-11-15 DIAGNOSIS — M4712 Other spondylosis with myelopathy, cervical region: Secondary | ICD-10-CM | POA: Diagnosis not present

## 2023-11-15 DIAGNOSIS — Z981 Arthrodesis status: Secondary | ICD-10-CM | POA: Diagnosis not present

## 2023-11-17 DIAGNOSIS — M4712 Other spondylosis with myelopathy, cervical region: Secondary | ICD-10-CM | POA: Diagnosis not present

## 2023-11-17 DIAGNOSIS — M1611 Unilateral primary osteoarthritis, right hip: Secondary | ICD-10-CM | POA: Diagnosis not present

## 2023-11-17 DIAGNOSIS — T8149XA Infection following a procedure, other surgical site, initial encounter: Secondary | ICD-10-CM | POA: Diagnosis not present

## 2023-11-17 DIAGNOSIS — M48061 Spinal stenosis, lumbar region without neurogenic claudication: Secondary | ICD-10-CM | POA: Diagnosis not present

## 2023-11-17 DIAGNOSIS — Z452 Encounter for adjustment and management of vascular access device: Secondary | ICD-10-CM | POA: Diagnosis not present

## 2023-11-17 DIAGNOSIS — M21379 Foot drop, unspecified foot: Secondary | ICD-10-CM | POA: Diagnosis not present

## 2023-11-17 DIAGNOSIS — T8131XD Disruption of external operation (surgical) wound, not elsewhere classified, subsequent encounter: Secondary | ICD-10-CM | POA: Diagnosis not present

## 2023-11-20 DIAGNOSIS — M48061 Spinal stenosis, lumbar region without neurogenic claudication: Secondary | ICD-10-CM | POA: Diagnosis not present

## 2023-11-20 DIAGNOSIS — M1611 Unilateral primary osteoarthritis, right hip: Secondary | ICD-10-CM | POA: Diagnosis not present

## 2023-11-20 DIAGNOSIS — T8149XA Infection following a procedure, other surgical site, initial encounter: Secondary | ICD-10-CM | POA: Diagnosis not present

## 2023-11-20 DIAGNOSIS — M21379 Foot drop, unspecified foot: Secondary | ICD-10-CM | POA: Diagnosis not present

## 2023-11-20 DIAGNOSIS — T8131XD Disruption of external operation (surgical) wound, not elsewhere classified, subsequent encounter: Secondary | ICD-10-CM | POA: Diagnosis not present

## 2023-11-20 DIAGNOSIS — M4712 Other spondylosis with myelopathy, cervical region: Secondary | ICD-10-CM | POA: Diagnosis not present

## 2023-11-24 DIAGNOSIS — M1611 Unilateral primary osteoarthritis, right hip: Secondary | ICD-10-CM | POA: Diagnosis not present

## 2023-11-24 DIAGNOSIS — M21379 Foot drop, unspecified foot: Secondary | ICD-10-CM | POA: Diagnosis not present

## 2023-11-24 DIAGNOSIS — M48061 Spinal stenosis, lumbar region without neurogenic claudication: Secondary | ICD-10-CM | POA: Diagnosis not present

## 2023-11-24 DIAGNOSIS — M4712 Other spondylosis with myelopathy, cervical region: Secondary | ICD-10-CM | POA: Diagnosis not present

## 2023-11-24 DIAGNOSIS — T8149XA Infection following a procedure, other surgical site, initial encounter: Secondary | ICD-10-CM | POA: Diagnosis not present

## 2023-11-24 DIAGNOSIS — T8131XD Disruption of external operation (surgical) wound, not elsewhere classified, subsequent encounter: Secondary | ICD-10-CM | POA: Diagnosis not present

## 2023-11-24 DIAGNOSIS — T8130XA Disruption of wound, unspecified, initial encounter: Secondary | ICD-10-CM | POA: Diagnosis not present

## 2023-11-25 DIAGNOSIS — M47812 Spondylosis without myelopathy or radiculopathy, cervical region: Secondary | ICD-10-CM | POA: Diagnosis not present

## 2023-11-25 DIAGNOSIS — Z981 Arthrodesis status: Secondary | ICD-10-CM | POA: Diagnosis not present

## 2023-11-25 DIAGNOSIS — M5030 Other cervical disc degeneration, unspecified cervical region: Secondary | ICD-10-CM | POA: Diagnosis not present

## 2023-11-27 DIAGNOSIS — Z452 Encounter for adjustment and management of vascular access device: Secondary | ICD-10-CM | POA: Diagnosis not present

## 2023-11-27 DIAGNOSIS — T847XXD Infection and inflammatory reaction due to other internal orthopedic prosthetic devices, implants and grafts, subsequent encounter: Secondary | ICD-10-CM | POA: Diagnosis not present

## 2023-11-27 DIAGNOSIS — Z79899 Other long term (current) drug therapy: Secondary | ICD-10-CM | POA: Diagnosis not present

## 2023-11-27 DIAGNOSIS — Z792 Long term (current) use of antibiotics: Secondary | ICD-10-CM | POA: Diagnosis not present

## 2023-11-27 DIAGNOSIS — T8130XA Disruption of wound, unspecified, initial encounter: Secondary | ICD-10-CM | POA: Diagnosis not present

## 2023-11-28 DIAGNOSIS — M48061 Spinal stenosis, lumbar region without neurogenic claudication: Secondary | ICD-10-CM | POA: Diagnosis not present

## 2023-11-28 DIAGNOSIS — M4712 Other spondylosis with myelopathy, cervical region: Secondary | ICD-10-CM | POA: Diagnosis not present

## 2023-11-28 DIAGNOSIS — M21379 Foot drop, unspecified foot: Secondary | ICD-10-CM | POA: Diagnosis not present

## 2023-11-28 DIAGNOSIS — T8131XD Disruption of external operation (surgical) wound, not elsewhere classified, subsequent encounter: Secondary | ICD-10-CM | POA: Diagnosis not present

## 2023-11-28 DIAGNOSIS — M1611 Unilateral primary osteoarthritis, right hip: Secondary | ICD-10-CM | POA: Diagnosis not present

## 2023-11-28 DIAGNOSIS — T8149XA Infection following a procedure, other surgical site, initial encounter: Secondary | ICD-10-CM | POA: Diagnosis not present

## 2023-12-01 DIAGNOSIS — T8131XA Disruption of external operation (surgical) wound, not elsewhere classified, initial encounter: Secondary | ICD-10-CM | POA: Diagnosis not present

## 2023-12-01 DIAGNOSIS — T8130XA Disruption of wound, unspecified, initial encounter: Secondary | ICD-10-CM | POA: Diagnosis not present

## 2023-12-05 DIAGNOSIS — M1611 Unilateral primary osteoarthritis, right hip: Secondary | ICD-10-CM | POA: Diagnosis not present

## 2023-12-05 DIAGNOSIS — T8131XD Disruption of external operation (surgical) wound, not elsewhere classified, subsequent encounter: Secondary | ICD-10-CM | POA: Diagnosis not present

## 2023-12-05 DIAGNOSIS — T8149XA Infection following a procedure, other surgical site, initial encounter: Secondary | ICD-10-CM | POA: Diagnosis not present

## 2023-12-05 DIAGNOSIS — M21379 Foot drop, unspecified foot: Secondary | ICD-10-CM | POA: Diagnosis not present

## 2023-12-05 DIAGNOSIS — M48061 Spinal stenosis, lumbar region without neurogenic claudication: Secondary | ICD-10-CM | POA: Diagnosis not present

## 2023-12-05 DIAGNOSIS — M4712 Other spondylosis with myelopathy, cervical region: Secondary | ICD-10-CM | POA: Diagnosis not present

## 2023-12-08 DIAGNOSIS — T8130XA Disruption of wound, unspecified, initial encounter: Secondary | ICD-10-CM | POA: Diagnosis not present

## 2023-12-08 DIAGNOSIS — T8131XA Disruption of external operation (surgical) wound, not elsewhere classified, initial encounter: Secondary | ICD-10-CM | POA: Diagnosis not present

## 2023-12-19 DIAGNOSIS — T8149XA Infection following a procedure, other surgical site, initial encounter: Secondary | ICD-10-CM | POA: Diagnosis not present

## 2023-12-19 DIAGNOSIS — F419 Anxiety disorder, unspecified: Secondary | ICD-10-CM | POA: Diagnosis not present

## 2023-12-19 DIAGNOSIS — G5731 Lesion of lateral popliteal nerve, right lower limb: Secondary | ICD-10-CM | POA: Diagnosis not present

## 2023-12-19 DIAGNOSIS — M21379 Foot drop, unspecified foot: Secondary | ICD-10-CM | POA: Diagnosis not present

## 2023-12-19 DIAGNOSIS — F32A Depression, unspecified: Secondary | ICD-10-CM | POA: Diagnosis not present

## 2023-12-19 DIAGNOSIS — M4726 Other spondylosis with radiculopathy, lumbar region: Secondary | ICD-10-CM | POA: Diagnosis not present

## 2023-12-19 DIAGNOSIS — M1611 Unilateral primary osteoarthritis, right hip: Secondary | ICD-10-CM | POA: Diagnosis not present

## 2023-12-19 DIAGNOSIS — M48061 Spinal stenosis, lumbar region without neurogenic claudication: Secondary | ICD-10-CM | POA: Diagnosis not present

## 2023-12-19 DIAGNOSIS — T8131XD Disruption of external operation (surgical) wound, not elsewhere classified, subsequent encounter: Secondary | ICD-10-CM | POA: Diagnosis not present

## 2023-12-19 DIAGNOSIS — M4712 Other spondylosis with myelopathy, cervical region: Secondary | ICD-10-CM | POA: Diagnosis not present

## 2023-12-19 DIAGNOSIS — Z981 Arthrodesis status: Secondary | ICD-10-CM | POA: Diagnosis not present

## 2023-12-19 DIAGNOSIS — E559 Vitamin D deficiency, unspecified: Secondary | ICD-10-CM | POA: Diagnosis not present

## 2023-12-23 DIAGNOSIS — Z981 Arthrodesis status: Secondary | ICD-10-CM | POA: Diagnosis not present

## 2023-12-23 DIAGNOSIS — M4322 Fusion of spine, cervical region: Secondary | ICD-10-CM | POA: Diagnosis not present

## 2023-12-23 DIAGNOSIS — M50222 Other cervical disc displacement at C5-C6 level: Secondary | ICD-10-CM | POA: Diagnosis not present

## 2024-01-15 DIAGNOSIS — Z792 Long term (current) use of antibiotics: Secondary | ICD-10-CM | POA: Diagnosis not present

## 2024-01-15 DIAGNOSIS — T847XXD Infection and inflammatory reaction due to other internal orthopedic prosthetic devices, implants and grafts, subsequent encounter: Secondary | ICD-10-CM | POA: Diagnosis not present

## 2024-02-06 DIAGNOSIS — M48061 Spinal stenosis, lumbar region without neurogenic claudication: Secondary | ICD-10-CM | POA: Diagnosis not present

## 2024-02-06 DIAGNOSIS — Z8659 Personal history of other mental and behavioral disorders: Secondary | ICD-10-CM | POA: Diagnosis not present

## 2024-02-06 DIAGNOSIS — G5731 Lesion of lateral popliteal nerve, right lower limb: Secondary | ICD-10-CM | POA: Diagnosis not present

## 2024-02-06 DIAGNOSIS — E7849 Other hyperlipidemia: Secondary | ICD-10-CM | POA: Diagnosis not present

## 2024-02-06 DIAGNOSIS — R29898 Other symptoms and signs involving the musculoskeletal system: Secondary | ICD-10-CM | POA: Diagnosis not present

## 2024-02-06 DIAGNOSIS — Z01818 Encounter for other preprocedural examination: Secondary | ICD-10-CM | POA: Diagnosis not present

## 2024-02-06 DIAGNOSIS — C61 Malignant neoplasm of prostate: Secondary | ICD-10-CM | POA: Diagnosis not present

## 2024-02-06 DIAGNOSIS — M79672 Pain in left foot: Secondary | ICD-10-CM | POA: Diagnosis not present

## 2024-02-06 DIAGNOSIS — M4712 Other spondylosis with myelopathy, cervical region: Secondary | ICD-10-CM | POA: Diagnosis not present

## 2024-02-23 DIAGNOSIS — Z981 Arthrodesis status: Secondary | ICD-10-CM | POA: Diagnosis not present

## 2024-02-23 DIAGNOSIS — F32A Depression, unspecified: Secondary | ICD-10-CM | POA: Diagnosis not present

## 2024-02-23 DIAGNOSIS — M4722 Other spondylosis with radiculopathy, cervical region: Secondary | ICD-10-CM | POA: Diagnosis not present

## 2024-02-23 DIAGNOSIS — D649 Anemia, unspecified: Secondary | ICD-10-CM | POA: Diagnosis not present

## 2024-02-23 DIAGNOSIS — M4712 Other spondylosis with myelopathy, cervical region: Secondary | ICD-10-CM | POA: Diagnosis not present

## 2024-02-23 DIAGNOSIS — Z9049 Acquired absence of other specified parts of digestive tract: Secondary | ICD-10-CM | POA: Diagnosis not present

## 2024-02-23 DIAGNOSIS — F419 Anxiety disorder, unspecified: Secondary | ICD-10-CM | POA: Diagnosis not present

## 2024-02-23 DIAGNOSIS — M4322 Fusion of spine, cervical region: Secondary | ICD-10-CM | POA: Diagnosis not present

## 2024-02-23 DIAGNOSIS — Z8546 Personal history of malignant neoplasm of prostate: Secondary | ICD-10-CM | POA: Diagnosis not present

## 2024-02-23 DIAGNOSIS — T84296A Other mechanical complication of internal fixation device of vertebrae, initial encounter: Secondary | ICD-10-CM | POA: Diagnosis not present

## 2024-02-23 DIAGNOSIS — Z79899 Other long term (current) drug therapy: Secondary | ICD-10-CM | POA: Diagnosis not present

## 2024-02-23 DIAGNOSIS — M47812 Spondylosis without myelopathy or radiculopathy, cervical region: Secondary | ICD-10-CM | POA: Diagnosis not present

## 2024-02-23 DIAGNOSIS — E785 Hyperlipidemia, unspecified: Secondary | ICD-10-CM | POA: Diagnosis not present

## 2024-02-23 DIAGNOSIS — T847XXD Infection and inflammatory reaction due to other internal orthopedic prosthetic devices, implants and grafts, subsequent encounter: Secondary | ICD-10-CM | POA: Diagnosis not present

## 2024-03-11 DIAGNOSIS — Z792 Long term (current) use of antibiotics: Secondary | ICD-10-CM | POA: Diagnosis not present

## 2024-03-11 DIAGNOSIS — Z79899 Other long term (current) drug therapy: Secondary | ICD-10-CM | POA: Diagnosis not present

## 2024-03-11 DIAGNOSIS — T847XXD Infection and inflammatory reaction due to other internal orthopedic prosthetic devices, implants and grafts, subsequent encounter: Secondary | ICD-10-CM | POA: Diagnosis not present

## 2024-03-17 DIAGNOSIS — Z4889 Encounter for other specified surgical aftercare: Secondary | ICD-10-CM | POA: Diagnosis not present

## 2024-04-07 DIAGNOSIS — M4322 Fusion of spine, cervical region: Secondary | ICD-10-CM | POA: Diagnosis not present

## 2024-04-07 DIAGNOSIS — M47812 Spondylosis without myelopathy or radiculopathy, cervical region: Secondary | ICD-10-CM | POA: Diagnosis not present

## 2024-04-07 DIAGNOSIS — Z981 Arthrodesis status: Secondary | ICD-10-CM | POA: Diagnosis not present

## 2024-04-12 ENCOUNTER — Ambulatory Visit
Admission: RE | Admit: 2024-04-12 | Discharge: 2024-04-12 | Disposition: A | Discharge status: Home / Self Care | Payer: Medicare Other | Source: Ambulatory Visit | Attending: Internal Medicine | Admitting: Internal Medicine

## 2024-04-12 DIAGNOSIS — M8589 Other specified disorders of bone density and structure, multiple sites: Secondary | ICD-10-CM

## 2024-04-12 DIAGNOSIS — M8588 Other specified disorders of bone density and structure, other site: Secondary | ICD-10-CM | POA: Diagnosis not present

## 2024-05-05 DIAGNOSIS — T847XXD Infection and inflammatory reaction due to other internal orthopedic prosthetic devices, implants and grafts, subsequent encounter: Secondary | ICD-10-CM | POA: Diagnosis not present

## 2024-05-05 DIAGNOSIS — B354 Tinea corporis: Secondary | ICD-10-CM | POA: Diagnosis not present

## 2024-05-05 DIAGNOSIS — Z1331 Encounter for screening for depression: Secondary | ICD-10-CM | POA: Diagnosis not present

## 2024-08-03 DIAGNOSIS — H524 Presbyopia: Secondary | ICD-10-CM | POA: Diagnosis not present

## 2024-08-03 DIAGNOSIS — H2513 Age-related nuclear cataract, bilateral: Secondary | ICD-10-CM | POA: Diagnosis not present

## 2024-09-06 DIAGNOSIS — Z23 Encounter for immunization: Secondary | ICD-10-CM | POA: Diagnosis not present

## 2024-10-06 DIAGNOSIS — M47812 Spondylosis without myelopathy or radiculopathy, cervical region: Secondary | ICD-10-CM | POA: Diagnosis not present

## 2024-10-06 DIAGNOSIS — M50323 Other cervical disc degeneration at C6-C7 level: Secondary | ICD-10-CM | POA: Diagnosis not present

## 2024-10-06 DIAGNOSIS — M4322 Fusion of spine, cervical region: Secondary | ICD-10-CM | POA: Diagnosis not present

## 2024-10-06 DIAGNOSIS — Z981 Arthrodesis status: Secondary | ICD-10-CM | POA: Diagnosis not present

## 2024-10-14 DIAGNOSIS — E7849 Other hyperlipidemia: Secondary | ICD-10-CM | POA: Diagnosis not present

## 2024-10-14 DIAGNOSIS — Z1212 Encounter for screening for malignant neoplasm of rectum: Secondary | ICD-10-CM | POA: Diagnosis not present

## 2024-10-25 DIAGNOSIS — R82998 Other abnormal findings in urine: Secondary | ICD-10-CM | POA: Diagnosis not present

## 2024-11-03 DIAGNOSIS — T847XXD Infection and inflammatory reaction due to other internal orthopedic prosthetic devices, implants and grafts, subsequent encounter: Secondary | ICD-10-CM | POA: Diagnosis not present

## 2024-11-03 DIAGNOSIS — Z1331 Encounter for screening for depression: Secondary | ICD-10-CM | POA: Diagnosis not present
